# Patient Record
Sex: Female | Born: 1980 | Race: White | Hispanic: No | Marital: Married | Smoking: Former smoker
Health system: Southern US, Community
[De-identification: ages and names within clinical notes are randomized; demographics above are authoritative.]

## PROBLEM LIST (undated history)

## (undated) DIAGNOSIS — F419 Anxiety disorder, unspecified: Secondary | ICD-10-CM

## (undated) HISTORY — PX: APPENDECTOMY: SHX54

---

## 2005-08-07 ENCOUNTER — Emergency Department: Payer: Self-pay | Admitting: Emergency Medicine

## 2006-11-07 ENCOUNTER — Ambulatory Visit: Payer: Self-pay

## 2006-12-28 ENCOUNTER — Observation Stay: Payer: Self-pay

## 2007-01-06 ENCOUNTER — Inpatient Hospital Stay: Payer: Self-pay

## 2010-01-01 ENCOUNTER — Inpatient Hospital Stay (HOSPITAL_COMMUNITY): Admission: RE | Admit: 2010-01-01 | Discharge: 2010-01-02 | Payer: Self-pay | Admitting: Certified Nurse Midwife

## 2010-01-03 ENCOUNTER — Encounter (INDEPENDENT_AMBULATORY_CARE_PROVIDER_SITE_OTHER): Payer: Self-pay | Admitting: Obstetrics & Gynecology

## 2010-01-03 ENCOUNTER — Inpatient Hospital Stay (HOSPITAL_COMMUNITY): Admission: AD | Admit: 2010-01-03 | Discharge: 2010-01-06 | Payer: Self-pay | Admitting: Obstetrics & Gynecology

## 2010-01-07 ENCOUNTER — Inpatient Hospital Stay (HOSPITAL_COMMUNITY): Admission: AD | Admit: 2010-01-07 | Discharge: 2010-01-07 | Payer: Self-pay | Admitting: Obstetrics & Gynecology

## 2010-11-14 LAB — URIC ACID
Uric Acid, Serum: 5.1 mg/dL (ref 2.4–7.0)
Uric Acid, Serum: 5.3 mg/dL (ref 2.4–7.0)
Uric Acid, Serum: 5.3 mg/dL (ref 2.4–7.0)

## 2010-11-14 LAB — CBC
HCT: 31.9 % — ABNORMAL LOW (ref 36.0–46.0)
HCT: 38.2 % (ref 36.0–46.0)
HCT: 40.6 % (ref 36.0–46.0)
Hemoglobin: 11 g/dL — ABNORMAL LOW (ref 12.0–15.0)
Hemoglobin: 13.1 g/dL (ref 12.0–15.0)
MCHC: 34.1 g/dL (ref 30.0–36.0)
MCV: 88.7 fL (ref 78.0–100.0)
MCV: 89.5 fL (ref 78.0–100.0)
Platelets: 197 10*3/uL (ref 150–400)
Platelets: 215 10*3/uL (ref 150–400)
Platelets: 258 10*3/uL (ref 150–400)
RBC: 3.59 MIL/uL — ABNORMAL LOW (ref 3.87–5.11)
RBC: 4.28 MIL/uL (ref 3.87–5.11)
RDW: 15.2 % (ref 11.5–15.5)
RDW: 15.3 % (ref 11.5–15.5)
WBC: 12.5 10*3/uL — ABNORMAL HIGH (ref 4.0–10.5)
WBC: 16.1 10*3/uL — ABNORMAL HIGH (ref 4.0–10.5)
WBC: 9.3 10*3/uL (ref 4.0–10.5)
WBC: 9.8 10*3/uL (ref 4.0–10.5)

## 2010-11-14 LAB — DIFFERENTIAL
Basophils Absolute: 0.1 10*3/uL (ref 0.0–0.1)
Basophils Relative: 1 % (ref 0–1)
Eosinophils Absolute: 0.2 10*3/uL (ref 0.0–0.7)
Neutrophils Relative %: 76 % (ref 43–77)

## 2010-11-14 LAB — COMPREHENSIVE METABOLIC PANEL
ALT: 13 U/L (ref 0–35)
ALT: 32 U/L (ref 0–35)
Albumin: 3 g/dL — ABNORMAL LOW (ref 3.5–5.2)
Albumin: 3.1 g/dL — ABNORMAL LOW (ref 3.5–5.2)
Alkaline Phosphatase: 141 U/L — ABNORMAL HIGH (ref 39–117)
Alkaline Phosphatase: 150 U/L — ABNORMAL HIGH (ref 39–117)
BUN: 7 mg/dL (ref 6–23)
BUN: 7 mg/dL (ref 6–23)
CO2: 29 mEq/L (ref 19–32)
Calcium: 8.8 mg/dL (ref 8.4–10.5)
Calcium: 9.4 mg/dL (ref 8.4–10.5)
Chloride: 105 mEq/L (ref 96–112)
Creatinine, Ser: 0.58 mg/dL (ref 0.4–1.2)
GFR calc Af Amer: >60 mL/min (ref >60)
GFR calc non Af Amer: >60 mL/min (ref >60)
Glucose, Bld: 80 mg/dL (ref 70–99)
Glucose, Bld: 83 mg/dL (ref 70–99)
Potassium: 3.2 mEq/L — ABNORMAL LOW (ref 3.5–5.1)
Potassium: 3.6 mEq/L (ref 3.5–5.1)
Potassium: 3.7 mEq/L (ref 3.5–5.1)
Sodium: 135 mEq/L (ref 135–145)
Total Bilirubin: 0.4 mg/dL (ref 0.3–1.2)
Total Bilirubin: 0.5 mg/dL (ref 0.3–1.2)
Total Protein: 5.9 g/dL — ABNORMAL LOW (ref 6.0–8.3)

## 2010-11-14 LAB — URINALYSIS, ROUTINE W REFLEX MICROSCOPIC
Glucose, UA: NEGATIVE mg/dL
Protein, ur: NEGATIVE mg/dL
Specific Gravity, Urine: 1.015 (ref 1.005–1.030)

## 2010-11-14 LAB — URINE MICROSCOPIC-ADD ON

## 2010-11-14 LAB — RPR
RPR Ser Ql: NONREACTIVE
RPR Ser Ql: NONREACTIVE

## 2010-11-14 LAB — LACTATE DEHYDROGENASE
LDH: 173 U/L (ref 94–250)
LDH: 185 U/L (ref 94–250)

## 2014-04-17 ENCOUNTER — Emergency Department: Payer: Self-pay | Admitting: Emergency Medicine

## 2014-04-18 LAB — CBC WITH DIFFERENTIAL/PLATELET
BASOS ABS: 0.1 10*3/uL (ref 0.0–0.1)
Basophil %: 1.6 %
Eosinophil #: 0.1 10*3/uL (ref 0.0–0.7)
Eosinophil %: 1.1 %
HCT: 39.3 % (ref 35.0–47.0)
HGB: 12.5 g/dL (ref 12.0–16.0)
LYMPHS ABS: 2.7 10*3/uL (ref 1.0–3.6)
Lymphocyte %: 38.5 %
MCH: 27.4 pg (ref 26.0–34.0)
MCHC: 31.7 g/dL — ABNORMAL LOW (ref 32.0–36.0)
MCV: 87 fL (ref 80–100)
MONO ABS: 0.5 x10 3/mm (ref 0.2–0.9)
MONOS PCT: 7.3 %
NEUTROS ABS: 3.6 10*3/uL (ref 1.4–6.5)
NEUTROS PCT: 51.5 %
PLATELETS: 270 10*3/uL (ref 150–440)
RBC: 4.55 10*6/uL (ref 3.80–5.20)
RDW: 14.1 % (ref 11.5–14.5)
WBC: 7 10*3/uL (ref 3.6–11.0)

## 2014-04-18 LAB — URINALYSIS, COMPLETE
BACTERIA: NONE SEEN
Bilirubin,UR: NEGATIVE
GLUCOSE, UR: NEGATIVE mg/dL (ref 0–75)
KETONE: NEGATIVE
LEUKOCYTE ESTERASE: NEGATIVE
Nitrite: NEGATIVE
PH: 6 (ref 4.5–8.0)
Protein: NEGATIVE
Specific Gravity: 1.011 (ref 1.003–1.030)
Squamous Epithelial: 1
WBC UR: NONE SEEN /HPF (ref 0–5)

## 2014-04-18 LAB — COMPREHENSIVE METABOLIC PANEL
ALBUMIN: 4.2 g/dL (ref 3.4–5.0)
ALT: 14 U/L
AST: 22 U/L (ref 15–37)
Alkaline Phosphatase: 78 U/L
Anion Gap: 5 — ABNORMAL LOW (ref 7–16)
BUN: 8 mg/dL (ref 7–18)
Bilirubin,Total: 0.2 mg/dL (ref 0.2–1.0)
CALCIUM: 9.1 mg/dL (ref 8.5–10.1)
CHLORIDE: 108 mmol/L — AB (ref 98–107)
CO2: 29 mmol/L (ref 21–32)
Creatinine: 0.82 mg/dL (ref 0.60–1.30)
Glucose: 98 mg/dL (ref 65–99)
OSMOLALITY: 281 (ref 275–301)
POTASSIUM: 3.5 mmol/L (ref 3.5–5.1)
SODIUM: 142 mmol/L (ref 136–145)
TOTAL PROTEIN: 7.6 g/dL (ref 6.4–8.2)

## 2014-04-18 LAB — PREGNANCY, URINE: PREGNANCY TEST, URINE: NEGATIVE m[IU]/mL

## 2014-04-18 LAB — TROPONIN I: Troponin-I: <0.02 ng/mL

## 2014-04-18 LAB — LIPASE, BLOOD: LIPASE: 150 U/L (ref 73–393)

## 2014-06-03 ENCOUNTER — Ambulatory Visit: Payer: Self-pay | Admitting: Family Medicine

## 2014-12-22 LAB — HM PAP SMEAR: HM PAP: NEGATIVE

## 2015-05-31 ENCOUNTER — Other Ambulatory Visit: Payer: Self-pay | Admitting: Obstetrics and Gynecology

## 2015-05-31 ENCOUNTER — Ambulatory Visit: Payer: Self-pay

## 2015-05-31 ENCOUNTER — Telehealth: Payer: Self-pay | Admitting: Obstetrics and Gynecology

## 2015-05-31 MED ORDER — NORETHINDRONE 0.35 MG PO TABS: 1.0000 | ORAL_TABLET | Freq: Every day | ORAL | Status: DC

## 2015-05-31 NOTE — Telephone Encounter (Signed)
PT CALLED AND SHE HAS ABOUT 2 WEEKS LEFT AND 1 REFILL ON THE BC YOU PERSCRIBED [facility], SHE WANTED TO KNOW IF YOU NEEDED TO SEE HER FOR A F/U ON THE BC PILL OR CAN SHE JUST GET A REFILL, SHE STATED IT IS WORKING WELL FOR HER.

## 2015-05-31 NOTE — Telephone Encounter (Signed)
Please let her know I sent in a refill, make sure the pharmacy gives her the same thing she was on and doesn't switch it to another generic

## 2015-05-31 NOTE — Telephone Encounter (Signed)
Sent rx to pharmacy

## 2015-06-06 ENCOUNTER — Telehealth: Payer: Self-pay | Admitting: Obstetrics and Gynecology

## 2015-06-06 ENCOUNTER — Other Ambulatory Visit: Payer: Self-pay | Admitting: Obstetrics and Gynecology

## 2015-06-06 NOTE — Telephone Encounter (Signed)
PT CALLED AND SHE STOPPED BY THE OFFICE TO DO A REFILL ON HER RX, ONE WAS SENT TO THE PHARMACY AND IT WAS A DIFFERENT GENERIC THEN WHAT SHE WAS ON, SO SHE WANTED TO KNOW IF THIS WAS WHAT WAS SUPPOSE TO HAPPEN OR THIS WAS A MISTAKE, SHE WOULD LIKE A CALL BACK.

## 2015-06-07 NOTE — Telephone Encounter (Signed)
Correct medication was sent in

## 2015-06-08 ENCOUNTER — Ambulatory Visit (INDEPENDENT_AMBULATORY_CARE_PROVIDER_SITE_OTHER): Payer: BLUE CROSS/BLUE SHIELD | Admitting: Family Medicine

## 2015-06-08 ENCOUNTER — Encounter: Payer: Self-pay | Admitting: Family Medicine

## 2015-06-08 VITALS — BP 126/82 | HR 73 | Temp 98.4°F | Ht 61.25 in | Wt 148.0 lb

## 2015-06-08 DIAGNOSIS — Z0000 Encounter for general adult medical examination without abnormal findings: Secondary | ICD-10-CM | POA: Diagnosis not present

## 2015-06-08 DIAGNOSIS — E663 Overweight: Secondary | ICD-10-CM | POA: Insufficient documentation

## 2015-06-08 DIAGNOSIS — Z23 Encounter for immunization: Secondary | ICD-10-CM

## 2015-06-08 NOTE — Patient Instructions (Addendum)
Try to follow the DASH guidelines Consider the mammogram, as some groups recommend a baseline at age 35 and yearly starting at age 35; another group says it's okay to wait until age 14, so there is a lot of variation in our recommendations Try to eat better and move more Next physical in one year  We drew labs today If you have not heard anything from my staff in a week about any orders/referrals/studies from today, please contact us here to follow-up [phone] You received the flu shot today; it should protect you against the flu virus over the coming months; it will take about two weeks for antibodies to develop; do try to stay away from hospitals, nursing homes, and daycares during peak flu season; taking 1000 mg of vitamin C daily during flu season may help you avoid getting sick Check out the information at familydoctor.org entitled "What It Takes to Lose Weight" Try to lose between 1 pound per week by taking in fewer calories and burning off more calories You can succeed by limiting portions, limiting foods dense in calories and fat, becoming more active, and drinking 8 glasses of water a day (64 ounces) Don't skip meals, especially breakfast, as skipping meals may alter your metabolism Do not use over-the-counter weight loss pills or gimmicks that claim rapid weight loss A healthy BMI (or body mass index) is between 18.5 and 24.9 You can calculate your ideal BMI at the [place] website [employer]fr   Health Maintenance, Female Adopting a healthy lifestyle and getting preventive care can go a long way to promote health and wellness. Talk with your health care provider about what schedule of regular examinations is right for you. This is a good chance for you to check in with your provider about disease prevention and staying healthy. In between checkups, there are plenty of things you can do on your own. Experts have done a lot of  research about which lifestyle changes and preventive measures are most likely to keep you healthy. Ask your health care provider for more information. WEIGHT AND DIET  Eat a healthy diet  Be sure to include plenty of vegetables, fruits, low-fat dairy products, and lean protein.  Do not eat a lot of foods high in solid fats, added sugars, or salt.  Get regular exercise. This is one of the most important things you can do for your health.  Most adults should exercise for at least 150 minutes each week. The exercise should increase your heart rate and make you sweat (moderate-intensity exercise).  Most adults should also do strengthening exercises at least twice a week. This is in addition to the moderate-intensity exercise.  Maintain a healthy weight  Body mass index (BMI) is a measurement that can be used to identify possible weight problems. It estimates body fat based on height and weight. Your health care provider can help determine your BMI and help you achieve or maintain a healthy weight.  For females 35 years of age and older:   A BMI below 18.5 is considered underweight.  A BMI of 18.5 to 24.9 is normal.  A BMI of 25 to 29.9 is considered overweight.  A BMI of 30 and above is considered obese.  Watch levels of cholesterol and blood lipids  You should start having your blood tested for lipids and cholesterol at 35 years of age, then have this test every 5 years.  You may need to have your cholesterol levels checked more often if:  Your lipid  or cholesterol levels are high.  You are older than 35 years of age.  You are at high risk for heart disease.  CANCER SCREENING   Lung Cancer  Lung cancer screening is recommended for adults 35-61 years old who are at high risk for lung cancer because of a history of smoking.  A yearly low-dose CT scan of the lungs is recommended for people who:  Currently smoke.  Have quit within the past 35 years.  Have at least a  35-pack-year history of smoking. A pack year is smoking an average of one pack of cigarettes a day for 1 year.  Yearly screening should continue until it has been 15 years since you quit.  Yearly screening should stop if you develop a health problem that would prevent you from having lung cancer treatment.  Breast Cancer  Practice breast self-awareness. This means understanding how your breasts normally appear and feel.  It also means doing regular breast self-exams. Let your health care provider know about any changes, no matter how small.  If you are in your 20s or 35s, you should have a clinical breast exam (CBE) by a health care provider every 1-3 years as part of a regular health exam.  If you are 35 or older, have a CBE every year. Also consider having a breast X-ray (mammogram) every year.  If you have a family history of breast cancer, talk to your health care provider about genetic screening.  If you are at high risk for breast cancer, talk to your health care provider about having an MRI and a mammogram every year.  Breast cancer gene (BRCA) assessment is recommended for women who have family members with BRCA-related cancers. BRCA-related cancers include:  Breast.  Ovarian.  Tubal.  Peritoneal cancers.  Results of the assessment will determine the need for genetic counseling and BRCA1 and BRCA2 testing. Cervical Cancer Your health care provider may recommend that you be screened regularly for cancer of the pelvic organs (ovaries, uterus, and vagina). This screening involves a pelvic examination, including checking for microscopic changes to the surface of your cervix (Pap test). You may be encouraged to have this screening done every 3 years, beginning at age 35.  For women ages 35-65, health care providers may recommend pelvic exams and Pap testing every 3 years, or they may recommend the Pap and pelvic exam, combined with testing for human papilloma virus (HPV), every 5  years. Some types of HPV increase your risk of cervical cancer. Testing for HPV may also be done on women of any age with unclear Pap test results.  Other health care providers may not recommend any screening for nonpregnant women who are considered low risk for pelvic cancer and who do not have symptoms. Ask your health care provider if a screening pelvic exam is right for you.  If you have had past treatment for cervical cancer or a condition that could lead to cancer, you need Pap tests and screening for cancer for at least 20 years after your treatment. If Pap tests have been discontinued, your risk factors (such as having a new sexual partner) need to be reassessed to determine if screening should resume. Some women have medical problems that increase the chance of getting cervical cancer. In these cases, your health care provider may recommend more frequent screening and Pap tests. Colorectal Cancer  This type of cancer can be detected and often prevented.  Routine colorectal cancer screening usually begins at 35 years of age  and continues through 35 years of age.  Your health care provider may recommend screening at an earlier age if you have risk factors for colon cancer.  Your health care provider may also recommend using home test kits to check for hidden blood in the stool.  A small camera at the end of a tube can be used to examine your colon directly (sigmoidoscopy or colonoscopy). This is done to check for the earliest forms of colorectal cancer.  Routine screening usually begins at age 25.  Direct examination of the colon should be repeated every 5-10 years through 35 years of age. However, you may need to be screened more often if early forms of precancerous polyps or small growths are found. Skin Cancer  Check your skin from head to toe regularly.  Tell your health care provider about any new moles or changes in moles, especially if there is a change in a mole's shape or  color.  Also tell your health care provider if you have a mole that is larger than the size of a pencil eraser.  Always use sunscreen. Apply sunscreen liberally and repeatedly throughout the day.  Protect yourself by wearing long sleeves, pants, a wide-brimmed hat, and sunglasses whenever you are outside. HEART DISEASE, DIABETES, AND HIGH BLOOD PRESSURE   High blood pressure causes heart disease and increases the risk of stroke. High blood pressure is more likely to develop in:  People who have blood pressure in the high end of the normal range (130-139/85-89 mm Hg).  People who are overweight or obese.  People who are African American.  If you are 19-33 years of age, have your blood pressure checked every 3-5 years. If you are 65 years of age or older, have your blood pressure checked every year. You should have your blood pressure measured twice--once when you are at a hospital or clinic, and once when you are not at a hospital or clinic. Record the average of the two measurements. To check your blood pressure when you are not at a hospital or clinic, you can use:  An automated blood pressure machine at a pharmacy.  A home blood pressure monitor.  If you are between 47 years and 31 years old, ask your health care provider if you should take aspirin to prevent strokes.  Have regular diabetes screenings. This involves taking a blood sample to check your fasting blood sugar level.  If you are at a normal weight and have a low risk for diabetes, have this test once every three years after 35 years of age.  If you are overweight and have a high risk for diabetes, consider being tested at a younger age or more often. PREVENTING INFECTION  Hepatitis B  If you have a higher risk for hepatitis B, you should be screened for this virus. You are considered at high risk for hepatitis B if:  You were born in a country where hepatitis B is common. Ask your health care provider which countries  are considered high risk.  Your parents were born in a high-risk country, and you have not been immunized against hepatitis B (hepatitis B vaccine).  You have HIV or AIDS.  You use needles to inject street drugs.  You live with someone who has hepatitis B.  You have had sex with someone who has hepatitis B.  You get hemodialysis treatment.  You take certain medicines for conditions, including cancer, organ transplantation, and autoimmune conditions. Hepatitis C  Blood testing is recommended  for:  Everyone born from [birth year] through 1966.  Anyone with known risk factors for hepatitis C. Sexually transmitted infections (STIs)  You should be screened for sexually transmitted infections (STIs) including gonorrhea and chlamydia if:  You are sexually active and are younger than 35 years of age.  You are older than 35 years of age and your health care provider tells you that you are at risk for this type of infection.  Your sexual activity has changed since you were last screened and you are at an increased risk for chlamydia or gonorrhea. Ask your health care provider if you are at risk.  If you do not have HIV, but are at risk, it may be recommended that you take a prescription medicine daily to prevent HIV infection. This is called pre-exposure prophylaxis (PrEP). You are considered at risk if:  You are sexually active and do not regularly use condoms or know the HIV status of your partner(s).  You take drugs by injection.  You are sexually active with a partner who has HIV. Talk with your health care provider about whether you are at high risk of being infected with HIV. If you choose to begin PrEP, you should first be tested for HIV. You should then be tested every 3 months for as long as you are taking PrEP.  PREGNANCY   If you are premenopausal and you may become pregnant, ask your health care provider about preconception counseling.  If you may become pregnant, take 400 to  800 micrograms (mcg) of folic acid every day.  If you want to prevent pregnancy, talk to your health care provider about birth control (contraception). OSTEOPOROSIS AND MENOPAUSE   Osteoporosis is a disease in which the bones lose minerals and strength with aging. This can result in serious bone fractures. Your risk for osteoporosis can be identified using a bone density scan.  If you are 19 years of age or older, or if you are at risk for osteoporosis and fractures, ask your health care provider if you should be screened.  Ask your health care provider whether you should take a calcium or vitamin D supplement to lower your risk for osteoporosis.  Menopause may have certain physical symptoms and risks.  Hormone replacement therapy may reduce some of these symptoms and risks. Talk to your health care provider about whether hormone replacement therapy is right for you.  HOME CARE INSTRUCTIONS   Schedule regular health, dental, and eye exams.  Stay current with your immunizations.   Do not use any tobacco products including cigarettes, chewing tobacco, or electronic cigarettes.  If you are pregnant, do not drink alcohol.  If you are breastfeeding, limit how much and how often you drink alcohol.  Limit alcohol intake to no more than 1 drink per day for nonpregnant women. One drink equals 12 ounces of beer, 5 ounces of wine, or 1 ounces of hard liquor.  Do not use street drugs.  Do not share needles.  Ask your health care provider for help if you need support or information about quitting drugs.  Tell your health care provider if you often feel depressed.  Tell your health care provider if you have ever been abused or do not feel safe at home.   This information is not intended to replace advice given to you by your health care provider. Make sure you discuss any questions you have with your health care provider.   Document Released: 02/26/2011 Document Revised: 09/03/2014  Document Reviewed: 07/15/2013  Chartered certified accountant Patient Education [employer].

## 2015-06-08 NOTE — Assessment & Plan Note (Signed)
Encouraged modest weight loss; see AVS 

## 2015-06-08 NOTE — Progress Notes (Signed)
Patient ID: Veronica Jenkins, female   DOB: [birth year], 34 y.o.   MRN: [id number]  Subjective:   Veronica Jenkins is a 34 y.o. female here for a complete physical exam  Interim issues since last visit: no medical excitement; seeing ENT, he recommended CBC with diff, swollen gland on the left side of the neck  USPSTF grade A and B recommendations: Alcohol: socially, less than 7 per week; no more than 3 at any one time Depression screen [facility] 2/9 06/08/2015  Decreased Interest 0  Down, Depressed, Hopeless 0  PHQ - 2 Score 0  HTN: prehypertension, no meds, DASH guidelines Obesity: no, mildly overweight Tobacco: n/a BRCA gene screening: n/a Abuse: no Mammogram: discussed Cervical cancer: last pap May, breast and pelvic exams by gyn Lipid and glucose: had a banana and coffee, cream and sugar HIV, hepatitis B and C: politely declined Colorectal cancer: no early family members with colorectal cancer, start at 68 Diet: typical American diet; not enough fruits and veggies Exercise: no regular exercise, not active enough Skin cancer: no tanning beds, no lengthy unprotected peak sun exposure  History reviewed. No pertinent past medical history. Past Surgical History  Procedure Laterality Date  . Cesarean section     Family History  Problem Relation Age of Onset  . Congestive Heart Failure Maternal Grandfather   . Heart disease Maternal Grandfather   . Diabetes Neg Hx   . Cancer Neg Hx   . Hypertension Neg Hx   . Stroke Neg Hx   . COPD Neg Hx    Social History  Substance Use Topics  . Smoking status: Former Smoker -- 0.50 packs/day for 10 years    Types: Cigarettes    Quit date: 06/07/2008  . Smokeless tobacco: Never Used  . Alcohol Use: No     Comment: rare   Review of Systems  Constitutional: Negative for diaphoresis and unexpected weight change.  HENT: Negative for dental problem (goes every 6 months), hearing loss and mouth sores.   Eyes: Negative for visual disturbance.   Respiratory: Negative for cough and shortness of breath.   Cardiovascular: Negative for chest pain and palpitations.  Gastrointestinal: Negative for blood in stool.  Endocrine: Negative for cold intolerance, heat intolerance and polydipsia.  Genitourinary: Negative for dysuria and hematuria.  Musculoskeletal: Negative for joint swelling and arthralgias.  Skin:       No worrisome moles  Allergic/Immunologic: Negative for food allergies.  Neurological: Negative for tremors.  Hematological: Does not bruise/bleed easily.  Psychiatric/Behavioral: Negative for dysphoric mood.    Objective:   Filed Vitals:   06/08/15 1021  BP: 126/82  Pulse: 73  Temp: 98.4 F (36.9 C)  Height: 5' 1.25" (1.556 m)  Weight: 148 lb (67.132 kg)  SpO2: 99%   Body mass index is 27.73 kg/(m^2). Wt Readings from Last 3 Encounters:  06/08/15 148 lb (67.132 kg)  11/30/14 145 lb (65.772 kg)   Physical Exam  Constitutional: She appears well-developed and well-nourished.  HENT:  Head: Normocephalic and atraumatic.  Right Ear: Hearing, tympanic membrane, external ear and ear canal normal.  Left Ear: Hearing, tympanic membrane, external ear and ear canal normal.  Eyes: Conjunctivae and EOM are normal. Right eye exhibits no hordeolum. Left eye exhibits no hordeolum. No scleral icterus.  Neck: Carotid bruit is not present. No thyromegaly present.  Cardiovascular: Normal rate, regular rhythm, S1 normal, S2 normal and normal heart sounds.   No extrasystoles are present.  No murmur heard. Pulmonary/Chest: Effort normal and  breath sounds normal. No respiratory distress. She has no wheezes.  Abdominal: Soft. Normal appearance and bowel sounds are normal. She exhibits no distension, no abdominal bruit, no pulsatile midline mass and no mass. There is no hepatosplenomegaly. There is no tenderness. There is no guarding. No hernia.  Musculoskeletal: Normal range of motion. She exhibits no edema.  Lymphadenopathy:        Head (right side): No submandibular adenopathy present.       Head (left side): No submandibular adenopathy present.    She has no cervical adenopathy.    She has no axillary adenopathy.  Neurological: She is alert. She displays no tremor. No cranial nerve deficit. She exhibits normal muscle tone. Gait normal.  Reflex Scores:      Patellar reflexes are 2+ on the right side and 2+ on the left side. Skin: Skin is warm and dry. No bruising and no ecchymosis noted. No cyanosis. No pallor.  Psychiatric: Her speech is normal and behavior is normal. Thought content normal. Her mood appears not anxious. She does not exhibit a depressed mood.    Assessment/Plan:   Problem List Items Addressed This Visit      Other   Needs flu shot    Flu vaccine given; may take 2 weeks to develop antibodies; avoid nursing homes, daycares, etc during peak flu season; hand hygiene encouraged      Preventative health care - Primary    USPSTF grade A and B recommendations reviewed with patient; age-appropriate recommendations, preventive care, screening tests, etc discussed and encouraged; healthy living encouraged; see AVS for patient education given to patient; GYN components done through gynecologist's office; she will consider mammogram, discussed screening in detail      Relevant Orders   CBC with Differential/Platelet   Lipid Panel w/o Chol/HDL Ratio   TSH   Comprehensive metabolic panel   Overweight (BMI 25.0-29.9)    Encouraged modest weight loss; see AVS       Other Visit Diagnoses    Encounter for immunization           No orders of the defined types were placed in this encounter.   Orders Placed This Encounter  Procedures  . Flu Vaccine QUAD 36+ mos IM  . CBC with Differential/Platelet  . Lipid Panel w/o Chol/HDL Ratio    Order Specific Question:  Has the patient fasted?    Answer:  Yes  . TSH  . Comprehensive metabolic panel    Order Specific Question:  Has the patient fasted?     Answer:  Yes    Follow up plan: Return in about 1 year (around 06/07/2016) for complete physical.  An after-visit summary was printed and given to the patient at [facility].  Please see the patient instructions which may contain other information and recommendations beyond what is mentioned above in the assessment and plan.  Orders Placed This Encounter  Procedures  . Flu Vaccine QUAD 36+ mos IM  . CBC with Differential/Platelet  . Lipid Panel w/o Chol/HDL Ratio  . TSH  . Comprehensive metabolic panel

## 2015-06-08 NOTE — Assessment & Plan Note (Signed)
Flu vaccine given; may take 2 weeks to develop antibodies; avoid nursing homes, daycares, etc during peak flu season; hand hygiene encouraged

## 2015-06-08 NOTE — Assessment & Plan Note (Signed)
USPSTF grade A and B recommendations reviewed with patient; age-appropriate recommendations, preventive care, screening tests, etc discussed and encouraged; healthy living encouraged; see AVS for patient education given to patient; GYN components done through gynecologist's office; she will consider mammogram, discussed screening in detail

## 2015-06-09 LAB — CBC WITH DIFFERENTIAL/PLATELET
BASOS ABS: 0 10*3/uL (ref 0.0–0.2)
Basos: 1 %
EOS (ABSOLUTE): 0.1 10*3/uL (ref 0.0–0.4)
Eos: 2 %
Hematocrit: 40.5 % (ref 34.0–46.6)
Hemoglobin: 13.3 g/dL (ref 11.1–15.9)
Immature Grans (Abs): 0 10*3/uL (ref 0.0–0.1)
Immature Granulocytes: 0 %
LYMPHS ABS: 2.2 10*3/uL (ref 0.7–3.1)
Lymphs: 38 %
MCH: 28.5 pg (ref 26.6–33.0)
MCHC: 32.8 g/dL (ref 31.5–35.7)
MCV: 87 fL (ref 79–97)
MONOS ABS: 0.6 10*3/uL (ref 0.1–0.9)
Monocytes: 11 %
Neutrophils Absolute: 2.7 10*3/uL (ref 1.4–7.0)
Neutrophils: 48 %
Platelets: 293 10*3/uL (ref 150–379)
RBC: 4.67 x10E6/uL (ref 3.77–5.28)
RDW: 14.4 % (ref 12.3–15.4)
WBC: 5.7 10*3/uL (ref 3.4–10.8)

## 2015-06-09 LAB — COMPREHENSIVE METABOLIC PANEL
ALBUMIN: 4.4 g/dL (ref 3.5–5.5)
ALT: 8 IU/L (ref 0–32)
AST: 18 IU/L (ref 0–40)
Albumin/Globulin Ratio: 1.9 (ref 1.1–2.5)
Alkaline Phosphatase: 62 IU/L (ref 39–117)
BUN / CREAT RATIO: 9 (ref 8–20)
BUN: 6 mg/dL (ref 6–20)
Bilirubin Total: 0.3 mg/dL (ref 0.0–1.2)
CALCIUM: 9.1 mg/dL (ref 8.7–10.2)
CO2: 24 mmol/L (ref 18–29)
CREATININE: 0.7 mg/dL (ref 0.57–1.00)
Chloride: 102 mmol/L (ref 97–108)
GFR calc Af Amer: 130 mL/min/{1.73_m2} (ref >59)
GFR, EST NON AFRICAN AMERICAN: 113 mL/min/{1.73_m2} (ref >59)
GLOBULIN, TOTAL: 2.3 g/dL (ref 1.5–4.5)
Glucose: 81 mg/dL (ref 65–99)
Potassium: 4.3 mmol/L (ref 3.5–5.2)
SODIUM: 143 mmol/L (ref 134–144)
Total Protein: 6.7 g/dL (ref 6.0–8.5)

## 2015-06-09 LAB — LIPID PANEL W/O CHOL/HDL RATIO
CHOLESTEROL TOTAL: 174 mg/dL (ref 100–199)
HDL: 62 mg/dL (ref >39)
LDL Calculated: 101 mg/dL — ABNORMAL HIGH (ref 0–99)
TRIGLYCERIDES: 53 mg/dL (ref 0–149)
VLDL Cholesterol Cal: 11 mg/dL (ref 5–40)

## 2015-06-09 LAB — TSH: TSH: 1.72 u[IU]/mL (ref 0.450–4.500)

## 2015-06-10 ENCOUNTER — Telehealth: Payer: Self-pay | Admitting: Obstetrics and Gynecology

## 2015-06-10 NOTE — Telephone Encounter (Signed)
Vaginal bleeding on and off since 10/1, not heavy, low dull back ache. Wondered if she needs to be seen?

## 2015-06-13 NOTE — Telephone Encounter (Signed)
States for the past 19 days she has 15 bleed days, not heavy, just very aggrevating pls advise

## 2015-06-14 NOTE — Telephone Encounter (Signed)
Please let her know if pharmacy gave her a different company generic of her pill that could be the reason, not a problem, but I know it is anoying.  She can let the pharmacy know she wants to get the 'exact' same type she was on before.

## 2015-06-14 NOTE — Telephone Encounter (Signed)
Called pt appt was made for 06/15/15

## 2015-06-15 ENCOUNTER — Encounter: Payer: Self-pay | Admitting: Family Medicine

## 2015-06-15 ENCOUNTER — Encounter: Payer: Self-pay | Admitting: Obstetrics and Gynecology

## 2015-06-15 ENCOUNTER — Ambulatory Visit (INDEPENDENT_AMBULATORY_CARE_PROVIDER_SITE_OTHER): Payer: BLUE CROSS/BLUE SHIELD | Admitting: Obstetrics and Gynecology

## 2015-06-15 VITALS — BP 117/86 | HR 76 | Ht 61.0 in | Wt 144.8 lb

## 2015-06-15 DIAGNOSIS — N921 Excessive and frequent menstruation with irregular cycle: Secondary | ICD-10-CM | POA: Diagnosis not present

## 2015-06-15 NOTE — Progress Notes (Signed)
Patient ID: Veronica Jenkins, female   DOB: December 11, 1980, 34 y.o.   MRN: [id number]  S: reports spotting for 3-4 days every 2 weeks since starting progestin only pill since starting in June, with heavier bleeding x 10 days.  O: A&O x4 Well groomed female in no distress thyroid normal Pelvic exam declined Labs from last week reviewed and allnormal  A: BTB on progestin only pill, with h/o dysmenorrhea and menorrhagia  P: desire cessation of pills at this time and seeing if menses with self regulate. Offered Lysteda, Mirena, Depo and combined OCPs- declined all at this time. Will keep menstrual chart and notify me if this worsen and desires treatment.  RTC prn  Meeyah Ovitt burr, CNM

## 2015-07-26 ENCOUNTER — Ambulatory Visit: Payer: BLUE CROSS/BLUE SHIELD | Admitting: Unknown Physician Specialty

## 2015-09-08 DIAGNOSIS — M41126 Adolescent idiopathic scoliosis, lumbar region: Secondary | ICD-10-CM | POA: Insufficient documentation

## 2015-12-12 ENCOUNTER — Ambulatory Visit: Payer: BLUE CROSS/BLUE SHIELD | Admitting: Family Medicine

## 2015-12-20 ENCOUNTER — Telehealth: Payer: Self-pay | Admitting: Family Medicine

## 2015-12-27 ENCOUNTER — Ambulatory Visit (INDEPENDENT_AMBULATORY_CARE_PROVIDER_SITE_OTHER): Payer: BLUE CROSS/BLUE SHIELD | Admitting: Obstetrics and Gynecology

## 2015-12-27 ENCOUNTER — Other Ambulatory Visit: Payer: Self-pay | Admitting: Obstetrics and Gynecology

## 2015-12-27 ENCOUNTER — Encounter: Payer: Self-pay | Admitting: Obstetrics and Gynecology

## 2015-12-27 VITALS — BP 128/100 | HR 98 | Ht 62.0 in | Wt 139.3 lb

## 2015-12-27 DIAGNOSIS — Z01419 Encounter for gynecological examination (general) (routine) without abnormal findings: Secondary | ICD-10-CM | POA: Diagnosis not present

## 2015-12-27 NOTE — Progress Notes (Signed)
Subjective:   Veronica Jenkins is a 35 y.o. No obstetric history on file. Caucasian female here for a routine well-woman exam.  Patient's last menstrual period was 12/23/2015.    Current complaints: PMS occasional inclusional vulvar cyst PCP: Lada       does desire labs  Social History: Sexual: heterosexual Marital Status: married Living situation: with spouse Occupation: homemaker Tobacco/alcohol: no tobacco use Illicit drugs: no history of illicit drug use  The following portions of the patient's history were reviewed and updated as appropriate: allergies, current medications, past family history, past medical history, past social history, past surgical history and problem list.  Past Medical History History reviewed. No pertinent past medical history.  Past Surgical History Past Surgical History  Procedure Laterality Date  . Cesarean section      Gynecologic History No obstetric history on file.  Patient's last menstrual period was 12/23/2015. Contraception: vasectomy Last Pap: 2016. Results were: normal   Obstetric History OB History  No data available    Current Medications No current outpatient prescriptions on file prior to visit.   No current facility-administered medications on file prior to visit.    Review of Systems Patient denies any headaches, blurred vision, shortness of breath, chest pain, abdominal pain, problems with bowel movements, urination, or intercourse.  Objective:  BP 128/100 mmHg  Pulse 98  Ht 5\' 2"  (1.575 m)  Wt 139 lb 4.8 oz (63.186 kg)  BMI 25.47 kg/m2  LMP 12/23/2015 Physical Exam  General:  Well developed, well nourished, no acute distress. She is alert and oriented x3. Skin:  Warm and dry Neck:  Midline trachea, no thyromegaly or nodules Cardiovascular: Regular rate and rhythm, no murmur heard Lungs:  Effort normal, all lung fields clear to auscultation bilaterally Breasts:  No dominant palpable mass, retraction, or nipple  discharge Abdomen:  Soft, non tender, no hepatosplenomegaly or masses Pelvic:  External genitalia is normal in appearance.  The vagina is normal in appearance. The cervix is bulbous, no CMT.  Thin prep pap is done with HR HPV cotesting. Uterus is felt to be normal size, shape, and contour.  No adnexal masses or tenderness noted. Extremities:  No swelling or varicosities noted Psych:  She has a normal mood and affect  Assessment:   Healthy well-woman exam   Plan:  Labs obtained F/U 1 year for AE, or sooner if needed   Ione [place] Suzan NailerN Rosaleigh Brazzel, CNM

## 2015-12-27 NOTE — Patient Instructions (Signed)
  Place annual gynecologic exam patient instructions here.  Thank you for enrolling in MyChart. Please follow the instructions below to securely access your online medical record. MyChart allows you to send messages to your doctor, view your test results, manage appointments, and more.   How Do I Sign Up? 1. In your Internet browser, go to [employer] and enter [email or web address]. 2. Click on the Sign Up Now link in the Sign In box. You will see the New Member Sign Up page. 3. Enter your MyChart Access Code exactly as it appears below. You will not need to use this code after you've completed the sign-up process. If you do not sign up before the expiration date, you must request a new code.  MyChart Access Code: [email or web address] Expires: 01/31/2016 11:20 AM  4. Enter your Social Security Number ([id number]) and Date of Birth (mm/dd/yyyy) as indicated and click Submit. You will be taken to the next sign-up page. 5. Create a MyChart ID. This will be your MyChart login ID and cannot be changed, so think of one that is secure and easy to remember. 6. Create a MyChart password. You can change your password at any time. 7. Enter your Password Reset Question and Answer. This can be used at a later time if you forget your password.  8. Enter your e-mail address. You will receive e-mail notification when new information is available in MyChart. 9. Click Sign Up. You can now view your medical record.   Additional Information Remember, MyChart is NOT to be used for urgent needs. For medical emergencies, dial 911.

## 2015-12-28 LAB — LIPID PANEL
CHOLESTEROL TOTAL: 207 mg/dL — AB (ref 100–199)
Chol/HDL Ratio: 3 ratio units (ref 0.0–4.4)
HDL: 69 mg/dL (ref >39)
LDL Calculated: 126 mg/dL — ABNORMAL HIGH (ref 0–99)
TRIGLYCERIDES: 61 mg/dL (ref 0–149)
VLDL Cholesterol Cal: 12 mg/dL (ref 5–40)

## 2015-12-28 LAB — COMPREHENSIVE METABOLIC PANEL
ALBUMIN: 4.9 g/dL (ref 3.5–5.5)
ALT: 11 IU/L (ref 0–32)
AST: 16 IU/L (ref 0–40)
Albumin/Globulin Ratio: 2.5 — ABNORMAL HIGH (ref 1.2–2.2)
Alkaline Phosphatase: 56 IU/L (ref 39–117)
BUN / CREAT RATIO: 9 (ref 9–23)
BUN: 7 mg/dL (ref 6–20)
Bilirubin Total: 0.2 mg/dL (ref 0.0–1.2)
CALCIUM: 9.7 mg/dL (ref 8.7–10.2)
CO2: 24 mmol/L (ref 18–29)
CREATININE: 0.75 mg/dL (ref 0.57–1.00)
Chloride: 104 mmol/L (ref 96–106)
GFR, EST AFRICAN AMERICAN: 119 mL/min/{1.73_m2} (ref >59)
GFR, EST NON AFRICAN AMERICAN: 104 mL/min/{1.73_m2} (ref >59)
GLOBULIN, TOTAL: 2 g/dL (ref 1.5–4.5)
Glucose: 79 mg/dL (ref 65–99)
Potassium: 4.3 mmol/L (ref 3.5–5.2)
SODIUM: 145 mmol/L — AB (ref 134–144)
TOTAL PROTEIN: 6.9 g/dL (ref 6.0–8.5)

## 2015-12-29 LAB — CYTOLOGY - PAP

## 2015-12-30 NOTE — Telephone Encounter (Signed)
Nothing in the note section. Closing note.

## 2016-03-24 ENCOUNTER — Ambulatory Visit
Admission: EM | Admit: 2016-03-24 | Discharge: 2016-03-24 | Disposition: A | Discharge status: Home / Self Care | Payer: BLUE CROSS/BLUE SHIELD | Attending: Emergency Medicine | Admitting: Emergency Medicine

## 2016-03-24 DIAGNOSIS — J0140 Acute pansinusitis, unspecified: Secondary | ICD-10-CM | POA: Diagnosis not present

## 2016-03-24 LAB — RAPID STREP SCREEN (MED CTR MEBANE ONLY): STREPTOCOCCUS, GROUP A SCREEN (DIRECT): NEGATIVE

## 2016-03-24 MED ORDER — AMOXICILLIN-POT CLAVULANATE 875-125 MG PO TABS: 1.0000 | ORAL_TABLET | Freq: Two times a day (BID) | ORAL | 0 refills | Status: DC

## 2016-03-24 MED ORDER — IBUPROFEN 800 MG PO TABS: 800.0000 mg | ORAL_TABLET | Freq: Three times a day (TID) | ORAL | 0 refills | Status: DC

## 2016-03-24 MED ORDER — MOMETASONE FUROATE 50 MCG/ACT NA SUSP: 2.0000 | Freq: Every day | NASAL | 0 refills | Status: DC

## 2016-03-24 NOTE — ED Provider Notes (Signed)
HPI  SUBJECTIVE:  Veronica Jenkins is a 35 y.o. female who presents with sinus pain and pressure, nasal congestion, rhinorrhea, ear pressure on the left more than right, postnasal drip, throbbing, constant maxillary headache that radiates to the back of her head starting 3 days ago. She reports body aches at first, and states that her upper teeth hurt. She reports purulent nasal drainage today. She reports sore throat, and a raspy voice, but no difficulty breathing, drooling, trismus. No fevers, nausea, vomiting, facial swelling. No sick contacts. No allergy type symptoms. She has tried Mucinex sinus -x, ayr saline spray and Tylenol without improvement. Symptoms are worse with lying down. No antibiotics in the past month. No antipyretic in the past 6-8 hours. She states that this is identical to previous sinus infections that she gets every year. She has a past medical history of sinusitis, enlarged cervical lymph nodes. No history of diabetes, hypertension. LMP: 7 days ago, denies possibility of being pregnant. PMD: Dr. Kizzie Ide at [facility] family [facility]   History reviewed. No pertinent past medical history.  Past Surgical History:  Procedure Laterality Date  . CESAREAN SECTION      Family History  Problem Relation Age of Onset  . Congestive Heart Failure Maternal Grandfather   . Heart disease Maternal Grandfather   . Diabetes Neg Hx   . Cancer Neg Hx   . Hypertension Neg Hx   . Stroke Neg Hx   . COPD Neg Hx     Social History  Substance Use Topics  . Smoking status: Former Smoker    Packs/day: 0.50    Years: 10.00    Types: Cigarettes    Quit date: 06/07/2008  . Smokeless tobacco: Never Used  . Alcohol use No     Comment: rare    No current facility-administered medications for this encounter.   Current Outpatient Prescriptions:  .  amoxicillin-clavulanate (AUGMENTIN) 875-125 MG tablet, Take 1 tablet by mouth 2 (two) times daily. X 7 days, Disp: 14 tablet, Rfl: 0 .  ibuprofen  (ADVIL,MOTRIN) 800 MG tablet, Take 1 tablet (800 mg total) by mouth 3 (three) times daily., Disp: 30 tablet, Rfl: 0 .  mometasone (NASONEX) 50 MCG/ACT nasal spray, Place 2 sprays into the nose daily., Disp: 17 g, Rfl: 0  No Known Allergies   ROS  As noted in HPI.   Physical Exam  BP 120/80 (BP Location: Right Arm)   Pulse 97   Temp 97.8 F (36.6 C) (Oral)   Resp 16   Ht  (1.575 m)   Wt 140 lb (63.5 kg)   LMP 03/14/2016 (Exact Date)   SpO2 100%   BMI 25.61 kg/m   Constitutional: Well developed, well nourished, no acute distress Eyes:  EOMI, conjunctiva normal bilaterally HENT: Normocephalic, atraumatic,mucus membranes moist. TMs normal bilaterally. + mild nasal congestion. Swollen  red turbinates.  - maxillary sinus tenderness,- frontal sinus tenderness. Dentition normal. Oropharynx slightly erythematous, positive cobblestoning, no postnasal drip.  Neck: Positive posterior cervical lymphadenopathy, patient states that this is a known and chronic issue for her. Respiratory: Normal inspiratory effort Cardiovascular: Normal rate GI: nondistended skin: No rash, skin intact Musculoskeletal: no deformities Neurologic: Alert & oriented x 3, no focal neuro deficits Psychiatric: Speech and behavior appropriate   ED Course   Medications - No data to display  Orders Placed This Encounter  Procedures  . Rapid strep screen    Standing Status:   Standing    Number of Occurrences:   1  .  Culture, group A strep    Standing Status:   Standing    Number of Occurrences:   1    Results for orders placed or performed during the hospital encounter of 03/24/16 (from the past 24 hour(s))  Rapid strep screen     Status: None   Collection Time: 03/24/16  8:44 AM  Result Value Ref Range   Streptococcus, Group A Screen (Direct) NEGATIVE NEGATIVE   No results found.  ED Clinical Impression  Acute pansinusitis, recurrence not specified   ED Assessment/Plan   Most likely  viral, however, she does have severe symptoms, so we'll send home with a wait-and-see prescription of Augmentin. Also to continue Mucinex D. Will start nasal steriods, increase fluids, nasal saline irrigation, 800 mg motrin prn pain. Discussed MDM and plan with pt. Discussed sn/sx that should prompt return to the  ED. Pt agrees with plan and will f/u with PMD prn.   *This clinic note was created using Dragon dictation software. Therefore, there may be occasional mistakes despite careful proofreading.  ?    Domenick Gong, MD 03/24/16 [phone]

## 2016-03-24 NOTE — Discharge Instructions (Signed)
Take the medication as written. You may take 800 mg of motrin with 1 gram of tylenol up to 3 times a day as needed for pain. This is an effective combination for pain.  Most sinus infections are viral and do not need antibiotics unless you have a high fever, have had this for 10 days, or you get better and then get sick again. Use a neti pot or the NeilMed sinus rinse as often as you want to to reduce nasal congestion. Follow the directions on the box.  ° °Go to www.goodrx.com to look up your medications. This will give you a list of where you can find your prescriptions at the most affordable prices.   °

## 2016-03-27 LAB — CULTURE, GROUP A STREP (THRC)

## 2016-06-13 ENCOUNTER — Encounter: Payer: Self-pay | Admitting: Family Medicine

## 2016-06-13 ENCOUNTER — Ambulatory Visit (INDEPENDENT_AMBULATORY_CARE_PROVIDER_SITE_OTHER): Payer: BLUE CROSS/BLUE SHIELD | Admitting: Family Medicine

## 2016-06-13 VITALS — BP 116/64 | HR 94 | Temp 98.1°F | Resp 14 | Ht 62.0 in | Wt 143.0 lb

## 2016-06-13 DIAGNOSIS — Z23 Encounter for immunization: Secondary | ICD-10-CM | POA: Diagnosis not present

## 2016-06-13 DIAGNOSIS — Z0000 Encounter for general adult medical examination without abnormal findings: Secondary | ICD-10-CM | POA: Diagnosis not present

## 2016-06-13 LAB — LIPID PANEL
CHOLESTEROL: 187 mg/dL (ref 125–200)
HDL: 66 mg/dL (ref >=46)
LDL CALC: 111 mg/dL (ref <130)
TRIGLYCERIDES: 49 mg/dL (ref <150)
Total CHOL/HDL Ratio: 2.8 Ratio (ref <=5.0)
VLDL: 10 mg/dL (ref <30)

## 2016-06-13 LAB — TSH: TSH: 1.7 m[IU]/L

## 2016-06-13 LAB — CBC WITH DIFFERENTIAL/PLATELET
BASOS ABS: 0 {cells}/uL (ref 0–200)
Basophils Relative: 0 %
EOS ABS: 153 {cells}/uL (ref 15–500)
EOS PCT: 3 %
HCT: 38.9 % (ref 35.0–45.0)
HEMOGLOBIN: 12.9 g/dL (ref 11.7–15.5)
LYMPHS ABS: 2397 {cells}/uL (ref 850–3900)
Lymphocytes Relative: 47 %
MCH: 28.2 pg (ref 27.0–33.0)
MCHC: 33.2 g/dL (ref 32.0–36.0)
MCV: 84.9 fL (ref 80.0–100.0)
MONOS PCT: 10 %
MPV: 9.7 fL (ref 7.5–12.5)
Monocytes Absolute: 510 cells/uL (ref 200–950)
NEUTROS PCT: 40 %
Neutro Abs: 2040 cells/uL (ref 1500–7800)
Platelets: 249 10*3/uL (ref 140–400)
RBC: 4.58 MIL/uL (ref 3.80–5.10)
RDW: 13.9 % (ref 11.0–15.0)
WBC: 5.1 10*3/uL (ref 3.8–10.8)

## 2016-06-13 LAB — COMPLETE METABOLIC PANEL WITH GFR
ALBUMIN: 4.4 g/dL (ref 3.6–5.1)
ALK PHOS: 53 U/L (ref 33–115)
ALT: 9 U/L (ref 6–29)
AST: 14 U/L (ref 10–30)
BILIRUBIN TOTAL: 0.3 mg/dL (ref 0.2–1.2)
BUN: 12 mg/dL (ref 7–25)
CO2: 27 mmol/L (ref 20–31)
Calcium: 9.5 mg/dL (ref 8.6–10.2)
Chloride: 105 mmol/L (ref 98–110)
Creat: 0.81 mg/dL (ref 0.50–1.10)
GLUCOSE: 85 mg/dL (ref 65–99)
POTASSIUM: 4.2 mmol/L (ref 3.5–5.3)
SODIUM: 140 mmol/L (ref 135–146)
TOTAL PROTEIN: 6.5 g/dL (ref 6.1–8.1)

## 2016-06-13 NOTE — Progress Notes (Signed)
Patient ID: Veronica Jenkins, female   DOB: 27-Mar-1981, 35 y.o.   MRN: [id number]   Subjective:   Veronica Jenkins is a 35 y.o. female here for a complete physical exam  Interim issues since last visit: no medical excitement  USPSTF grade A and B recommendations Alcohol: social, <7 drinks per week absolutely Depression:  Depression screen [facility] 2/9 06/13/2016 06/08/2015  Decreased Interest 0 0  Down, Depressed, Hopeless 0 0  PHQ - 2 Score 0 0   Hypertension: no Obesity: no Tobacco use: former smoker, quit in mid-20's HIV, hep B, hep C: declined STD testing and prevention (chl/gon/syphilis): declined Lipids: offered Glucose: offered Colorectal cancer: no fam hx; start at age 30 Breast cancer: sees gyn BRCA gene screening: sees gyn Intimate partner violence: no abuse Cervical cancer screening:  Sees gyn Lung cancer: n/a Osteoporosis: n/a Fall prevention/vitamin D: no vitamin D Diet: "horrible", typical American eater; loves sweets, addiction to chocolate; drinks water, coffee; not enough fruits and veggies; not much red meat Exercise: no activity really; tries to walk 30 minutes a day, but that doesn't always happen; sits all day Skin cancer: no worrisome moles  She says she needs a full CBC with differential; she was referred to ENT and wants a yearly CBC with diff; saw him regularly and has decided this is her "normal"; she is seeing him regularly as long as they don't enlarge  History reviewed. No pertinent past medical history.  MD notes: natural chickenpox, hx of shingles (age 40), measles ("three day"), hx of smoking (8 years x 1/2 ppd = 4 pack year smoking hx); hx of cervical lymphadenopathy, sees ENT periodically  Past Surgical History:  Procedure Laterality Date  . CESAREAN SECTION     Family History  Problem Relation Age of Onset  . Congestive Heart Failure Maternal Grandfather   . Heart disease Maternal Grandfather   . Diabetes Neg Hx   . Cancer Neg Hx   .  Hypertension Neg Hx   . Stroke Neg Hx   . COPD Neg Hx    Social History  Substance Use Topics  . Smoking status: Former Smoker    Packs/day: 0.50    Years: 10.00    Types: Cigarettes    Quit date: 06/07/2008  . Smokeless tobacco: Never Used  . Alcohol use No     Comment: rare   Review of Systems  Objective:   Vitals:   06/13/16 0820  BP: 116/64  Pulse: 94  Resp: 14  Temp: 98.1 F (36.7 C)  TempSrc: Oral  SpO2: 95%  Weight: 143 lb (64.9 kg)   Body mass index is 26.16 kg/m. Wt Readings from Last 3 Encounters:  06/13/16 143 lb (64.9 kg)  03/24/16 140 lb (63.5 kg)  12/27/15 139 lb 4.8 oz (63.2 kg)   Physical Exam  Constitutional: She appears well-developed and well-nourished.  HENT:  Head: Normocephalic and atraumatic.  Right Ear: Hearing, tympanic membrane, external ear and ear canal normal.  Left Ear: Hearing, tympanic membrane, external ear and ear canal normal.  Eyes: Conjunctivae and EOM are normal. Right eye exhibits no hordeolum. Left eye exhibits no hordeolum. No scleral icterus.  Neck: Carotid bruit is not present. No thyromegaly present.  Cardiovascular: Normal rate, regular rhythm, S1 normal, S2 normal and normal heart sounds.   No extrasystoles are present.  Pulmonary/Chest: Effort normal and breath sounds normal. No respiratory distress.  Abdominal: Soft. Normal appearance and bowel sounds are normal. She exhibits no distension, no abdominal  bruit, no pulsatile midline mass and no mass. There is no hepatosplenomegaly. There is no tenderness. No hernia.  Musculoskeletal: Normal range of motion. She exhibits no edema.  Lymphadenopathy:       Head (right side): No submandibular adenopathy present.       Head (left side): No submandibular adenopathy present.    She has cervical adenopathy.       Right cervical: Posterior cervical (shoddy) adenopathy present. No superficial cervical and no deep cervical adenopathy present.      Left cervical: Posterior  cervical (shoddy) adenopathy present. No superficial cervical and no deep cervical adenopathy present.  Neurological: She is alert. She displays no tremor. No cranial nerve deficit. She exhibits normal muscle tone. Gait normal.  Reflex Scores:      Patellar reflexes are 2+ on the right side and 2+ on the left side. Skin: Skin is warm and dry. No bruising and no ecchymosis noted. No cyanosis. No pallor.  Psychiatric: Her speech is normal and behavior is normal. Thought content normal. Her mood appears not anxious. She does not exhibit a depressed mood.    Assessment/Plan:   Problem List Items Addressed This Visit      Other   Preventative health care - Primary    USPSTF grade A and B recommendations reviewed with patient; age-appropriate recommendations, preventive care, screening tests, etc discussed and encouraged; healthy living encouraged; see AVS for patient education given to patient; try to incorporate more veggies; build up activity gradually; consider standing desk at work; GYN care through [employer]      Relevant Orders   Lipid panel   CBC with Differential/Platelet   COMPLETE METABOLIC PANEL WITH GFR   TSH   Needs flu shot   Relevant Orders   Flu Vaccine QUAD 36+ mos PF IM (Fluarix & Fluzone Quad PF) (Completed)    Other Visit Diagnoses   None.      No orders of the defined types were placed in this encounter.  Orders Placed This Encounter  Procedures  . Flu Vaccine QUAD 36+ mos PF IM (Fluarix & Fluzone Quad PF)  . Lipid panel  . CBC with Differential/Platelet  . COMPLETE METABOLIC PANEL WITH GFR  . TSH    Follow up plan: Return in about 1 year (around 06/13/2017) for complete physical.  An After Visit Summary was printed and given to the patient.

## 2016-06-13 NOTE — Patient Instructions (Addendum)
We'll get labs today If you have not heard anything from my staff in a week about any orders/referrals/studies from today, please contact us here to follow-up [phone]  Health Maintenance, Female Adopting a healthy lifestyle and getting preventive care can go a long way to promote health and wellness. Talk with your health care provider about what schedule of regular examinations is right for you. This is a good chance for you to check in with your provider about disease prevention and staying healthy. In between checkups, there are plenty of things you can do on your own. Experts have done a lot of research about which lifestyle changes and preventive measures are most likely to keep you healthy. Ask your health care provider for more information. WEIGHT AND DIET  Eat a healthy diet  Be sure to include plenty of vegetables, fruits, low-fat dairy products, and lean protein.  Do not eat a lot of foods high in solid fats, added sugars, or salt.  Get regular exercise. This is one of the most important things you can do for your health.  Most adults should exercise for at least 150 minutes each week. The exercise should increase your heart rate and make you sweat (moderate-intensity exercise).  Most adults should also do strengthening exercises at least twice a week. This is in addition to the moderate-intensity exercise.  Maintain a healthy weight  Body mass index (BMI) is a measurement that can be used to identify possible weight problems. It estimates body fat based on height and weight. Your health care provider can help determine your BMI and help you achieve or maintain a healthy weight.  For females 64 years of age and older:   A BMI below 18.5 is considered underweight.  A BMI of 18.5 to 24.9 is normal.  A BMI of 25 to 29.9 is considered overweight.  A BMI of 30 and above is considered obese.  Watch levels of cholesterol and blood lipids  You should start having your  blood tested for lipids and cholesterol at 36 years of age, then have this test every 5 years.  You may need to have your cholesterol levels checked more often if:  Your lipid or cholesterol levels are high.  You are older than 36 years of age.  You are at high risk for heart disease.  CANCER SCREENING   Lung Cancer  Lung cancer screening is recommended for adults 70-16 years old who are at high risk for lung cancer because of a history of smoking.  A yearly low-dose CT scan of the lungs is recommended for people who:  Currently smoke.  Have quit within the past 15 years.  Have at least a 30-pack-year history of smoking. A pack year is smoking an average of one pack of cigarettes a day for 1 year.  Yearly screening should continue until it has been 15 years since you quit.  Yearly screening should stop if you develop a health problem that would prevent you from having lung cancer treatment.  Breast Cancer  Practice breast self-awareness. This means understanding how your breasts normally appear and feel.  It also means doing regular breast self-exams. Let your health care provider know about any changes, no matter how small.  If you are in your 20s or 30s, you should have a clinical breast exam (CBE) by a health care provider every 1-3 years as part of a regular health exam.  If you are 27 or older, have a CBE every year. Also  consider having a breast X-ray (mammogram) every year.  If you have a family history of breast cancer, talk to your health care provider about genetic screening.  If you are at high risk for breast cancer, talk to your health care provider about having an MRI and a mammogram every year.  Breast cancer gene (BRCA) assessment is recommended for women who have family members with BRCA-related cancers. BRCA-related cancers include:  Breast.  Ovarian.  Tubal.  Peritoneal cancers.  Results of the assessment will determine the need for genetic  counseling and BRCA1 and BRCA2 testing. Cervical Cancer Your health care provider may recommend that you be screened regularly for cancer of the pelvic organs (ovaries, uterus, and vagina). This screening involves a pelvic examination, including checking for microscopic changes to the surface of your cervix (Pap test). You may be encouraged to have this screening done every 3 years, beginning at age 85.  For women ages 66-65, health care providers may recommend pelvic exams and Pap testing every 3 years, or they may recommend the Pap and pelvic exam, combined with testing for human papilloma virus (HPV), every 5 years. Some types of HPV increase your risk of cervical cancer. Testing for HPV may also be done on women of any age with unclear Pap test results.  Other health care providers may not recommend any screening for nonpregnant women who are considered low risk for pelvic cancer and who do not have symptoms. Ask your health care provider if a screening pelvic exam is right for you.  If you have had past treatment for cervical cancer or a condition that could lead to cancer, you need Pap tests and screening for cancer for at least 20 years after your treatment. If Pap tests have been discontinued, your risk factors (such as having a new sexual partner) need to be reassessed to determine if screening should resume. Some women have medical problems that increase the chance of getting cervical cancer. In these cases, your health care provider may recommend more frequent screening and Pap tests. Colorectal Cancer  This type of cancer can be detected and often prevented.  Routine colorectal cancer screening usually begins at 36 years of age and continues through 36 years of age.  Your health care provider may recommend screening at an earlier age if you have risk factors for colon cancer.  Your health care provider may also recommend using home test kits to check for hidden blood in the stool.  A  small camera at the end of a tube can be used to examine your colon directly (sigmoidoscopy or colonoscopy). This is done to check for the earliest forms of colorectal cancer.  Routine screening usually begins at age 41.  Direct examination of the colon should be repeated every 5-10 years through 36 years of age. However, you may need to be screened more often if early forms of precancerous polyps or small growths are found. Skin Cancer  Check your skin from head to toe regularly.  Tell your health care provider about any new moles or changes in moles, especially if there is a change in a mole's shape or color.  Also tell your health care provider if you have a mole that is larger than the size of a pencil eraser.  Always use sunscreen. Apply sunscreen liberally and repeatedly throughout the day.  Protect yourself by wearing long sleeves, pants, a wide-brimmed hat, and sunglasses whenever you are outside. HEART DISEASE, DIABETES, AND HIGH BLOOD PRESSURE   High  blood pressure causes heart disease and increases the risk of stroke. High blood pressure is more likely to develop in:  People who have blood pressure in the high end of the normal range (130-139/85-89 mm Hg).  People who are overweight or obese.  People who are African American.  If you are 46-75 years of age, have your blood pressure checked every 3-5 years. If you are 37 years of age or older, have your blood pressure checked every year. You should have your blood pressure measured twice--once when you are at a hospital or clinic, and once when you are not at a hospital or clinic. Record the average of the two measurements. To check your blood pressure when you are not at a hospital or clinic, you can use:  An automated blood pressure machine at a pharmacy.  A home blood pressure monitor.  If you are between 54 years and 18 years old, ask your health care provider if you should take aspirin to prevent strokes.  Have  regular diabetes screenings. This involves taking a blood sample to check your fasting blood sugar level.  If you are at a normal weight and have a low risk for diabetes, have this test once every three years after 36 years of age.  If you are overweight and have a high risk for diabetes, consider being tested at a younger age or more often. PREVENTING INFECTION  Hepatitis B  If you have a higher risk for hepatitis B, you should be screened for this virus. You are considered at high risk for hepatitis B if:  You were born in a country where hepatitis B is common. Ask your health care provider which countries are considered high risk.  Your parents were born in a high-risk country, and you have not been immunized against hepatitis B (hepatitis B vaccine).  You have HIV or AIDS.  You use needles to inject street drugs.  You live with someone who has hepatitis B.  You have had sex with someone who has hepatitis B.  You get hemodialysis treatment.  You take certain medicines for conditions, including cancer, organ transplantation, and autoimmune conditions. Hepatitis C  Blood testing is recommended for:  Everyone born from [birth year] through 1966.  Anyone with known risk factors for hepatitis C. Sexually transmitted infections (STIs)  You should be screened for sexually transmitted infections (STIs) including gonorrhea and chlamydia if:  You are sexually active and are younger than 36 years of age.  You are older than 36 years of age and your health care provider tells you that you are at risk for this type of infection.  Your sexual activity has changed since you were last screened and you are at an increased risk for chlamydia or gonorrhea. Ask your health care provider if you are at risk.  If you do not have HIV, but are at risk, it may be recommended that you take a prescription medicine daily to prevent HIV infection. This is called pre-exposure prophylaxis (PrEP). You are  considered at risk if:  You are sexually active and do not regularly use condoms or know the HIV status of your partner(s).  You take drugs by injection.  You are sexually active with a partner who has HIV. Talk with your health care provider about whether you are at high risk of being infected with HIV. If you choose to begin PrEP, you should first be tested for HIV. You should then be tested every 3 months for as long  as you are taking PrEP.  PREGNANCY   If you are premenopausal and you may become pregnant, ask your health care provider about preconception counseling.  If you may become pregnant, take 400 to 800 micrograms (mcg) of folic acid every day.  If you want to prevent pregnancy, talk to your health care provider about birth control (contraception). OSTEOPOROSIS AND MENOPAUSE   Osteoporosis is a disease in which the bones lose minerals and strength with aging. This can result in serious bone fractures. Your risk for osteoporosis can be identified using a bone density scan.  If you are 84 years of age or older, or if you are at risk for osteoporosis and fractures, ask your health care provider if you should be screened.  Ask your health care provider whether you should take a calcium or vitamin D supplement to lower your risk for osteoporosis.  Menopause may have certain physical symptoms and risks.  Hormone replacement therapy may reduce some of these symptoms and risks. Talk to your health care provider about whether hormone replacement therapy is right for you.  HOME CARE INSTRUCTIONS   Schedule regular health, dental, and eye exams.  Stay current with your immunizations.   Do not use any tobacco products including cigarettes, chewing tobacco, or electronic cigarettes.  If you are pregnant, do not drink alcohol.  If you are breastfeeding, limit how much and how often you drink alcohol.  Limit alcohol intake to no more than 1 drink per day for nonpregnant women. One  drink equals 12 ounces of beer, 5 ounces of wine, or 1 ounces of hard liquor.  Do not use street drugs.  Do not share needles.  Ask your health care provider for help if you need support or information about quitting drugs.  Tell your health care provider if you often feel depressed.  Tell your health care provider if you have ever been abused or do not feel safe at home.   This information is not intended to replace advice given to you by your health care provider. Make sure you discuss any questions you have with your health care provider.   Document Released: 02/26/2011 Document Revised: 09/03/2014 Document Reviewed: 07/15/2013 Elsevier Interactive Patient Education [employer].

## 2016-06-13 NOTE — Assessment & Plan Note (Signed)
USPSTF grade A and B recommendations reviewed with patient; age-appropriate recommendations, preventive care, screening tests, etc discussed and encouraged; healthy living encouraged; see AVS for patient education given to patient; try to incorporate more veggies; build up activity gradually; consider standing desk at work; [facility]

## 2016-09-03 ENCOUNTER — Encounter: Payer: Self-pay | Admitting: Family Medicine

## 2016-09-03 ENCOUNTER — Ambulatory Visit (INDEPENDENT_AMBULATORY_CARE_PROVIDER_SITE_OTHER): Payer: BLUE CROSS/BLUE SHIELD | Admitting: Family Medicine

## 2016-09-03 VITALS — BP 116/72 | HR 106 | Temp 99.2°F | Resp 18 | Ht 62.0 in | Wt 147.5 lb

## 2016-09-03 DIAGNOSIS — R059 Cough, unspecified: Secondary | ICD-10-CM

## 2016-09-03 DIAGNOSIS — R05 Cough: Secondary | ICD-10-CM

## 2016-09-03 MED ORDER — FLUTICASONE FUROATE-VILANTEROL 100-25 MCG/INH IN AEPB: 1.0000 | INHALATION_SPRAY | Freq: Every day | RESPIRATORY_TRACT | 0 refills | Status: DC

## 2016-09-03 MED ORDER — AZITHROMYCIN 250 MG PO TABS: ORAL_TABLET | ORAL | 0 refills | Status: DC

## 2016-09-03 MED ORDER — SALINE 0.65 % NA SOLN: 1.0000 | Freq: Two times a day (BID) | NASAL | 0 refills | Status: DC

## 2016-09-03 MED ORDER — BENZONATATE 100 MG PO CAPS: 100.0000 mg | ORAL_CAPSULE | Freq: Three times a day (TID) | ORAL | 0 refills | Status: DC | PRN

## 2016-09-03 NOTE — Progress Notes (Signed)
Name: Veronica Jenkins   MRN: [id number]    DOB: [birth year]   Date:09/03/2016       Progress Note  Subjective  Chief Complaint  Chief Complaint  Patient presents with  . Cough    Onset-3 weeks, unchanged-productive cough sometimes will get clear mucus-but feels like she is not getting all the mucus out. Chest heavyness and sneezing. Patient has taken Mucinex but no relief.    HPI  Cough: she had an URI three weeks ago, the nasal pressure and post-nasal drainage however still has a cough, that is still productive at times, but not always wet, no wheezing , but she has intermittent SOB and mild decrease in appetite. No fever. Tried otc medication without help. No family history or personal history of asthma, AR. Youngest daughter - 66 yo is sick also.    Patient Active Problem List   Diagnosis Date Noted  . Adolescent idiopathic scoliosis of lumbar region 09/08/2015  . Overweight (BMI 25.0-29.9) 06/08/2015    Past Surgical History:  Procedure Laterality Date  . CESAREAN SECTION      Family History  Problem Relation Age of Onset  . Congestive Heart Failure Maternal Grandfather   . Heart disease Maternal Grandfather   . Diabetes Neg Hx   . Cancer Neg Hx   . Hypertension Neg Hx   . Stroke Neg Hx   . COPD Neg Hx     Social History   Social History  . Marital status: Married    Spouse name: N/A  . Number of children: N/A  . Years of education: N/A   Occupational History  . Not on file.   Social History Main Topics  . Smoking status: Former Smoker    Packs/day: 0.50    Years: 10.00    Types: Cigarettes    Quit date: 06/07/2008  . Smokeless tobacco: Never Used  . Alcohol use No     Comment: rare  . Drug use: No  . Sexual activity: Yes   Other Topics Concern  . Not on file   Social History Narrative  . No narrative on file    No current outpatient prescriptions on file.  Allergies  Allergen Reactions  . Morphine Nausea Only and Nausea And Vomiting   Patient states she gets really sick     ROS  Ten systems reviewed and is negative except as mentioned in HPI   Objective  Vitals:   09/03/16 1357  BP: 116/72  Pulse: (!) 106  Resp: 18  Temp: 99.2 F (37.3 C)  TempSrc: Oral  SpO2: 98%  Weight: 147 lb 8 oz (66.9 kg)  Height: '5\' 2"'  (1.575 m)    Body mass index is 26.98 kg/m.  Physical Exam  Constitutional: Patient appears well-developed and well-nourished. Obese  No distress.  HEENT: head atraumatic, normocephalic, pupils equal and reactive to light, ears normal TMneck supple, throat within normal limits Cardiovascular: Normal rate, regular rhythm and normal heart sounds.  No murmur heard. No BLE edema. Pulmonary/Chest: Effort normal , mild scattered rhonchi. No respiratory distress. Abdominal: Soft.  There is no tenderness. Psychiatric: Patient has a normal mood and affect. behavior is normal. Judgment and thought content normal.  Recent Results (from the past 2160 hour(s))  Lipid panel     Status: None   Collection Time: 06/13/16  8:47 AM  Result Value Ref Range   Cholesterol 187 125 - 200 mg/dL   Triglycerides 49 <150 mg/dL   HDL 66 >=46 mg/dL  Total CHOL/HDL Ratio 2.8 <=5.0 Ratio   VLDL 10 <30 mg/dL   LDL Cholesterol 111 <130 mg/dL    Comment:   Total Cholesterol/HDL Ratio:CHD Risk                        Coronary Heart Disease Risk Table                                        Men       Women          1/2 Average Risk              3.4        3.3              Average Risk              5.0        4.4           2X Average Risk              9.6        7.1           3X Average Risk             23.4       11.0 Use the calculated Patient Ratio above and the CHD Risk table  to determine the patient's CHD Risk.   CBC with Differential/Platelet     Status: None   Collection Time: 06/13/16  8:47 AM  Result Value Ref Range   WBC 5.1 3.8 - 10.8 K/uL   RBC 4.58 3.80 - 5.10 MIL/uL   Hemoglobin 12.9 11.7 - 15.5 g/dL    HCT 38.9 35.0 - 45.0 %   MCV 84.9 80.0 - 100.0 fL   MCH 28.2 27.0 - 33.0 pg   MCHC 33.2 32.0 - 36.0 g/dL   RDW 13.9 11.0 - 15.0 %   Platelets 249 140 - 400 K/uL   MPV 9.7 7.5 - 12.5 fL   Neutro Abs 2,040 1,500 - 7,800 cells/uL   Lymphs Abs 2,397 850 - 3,900 cells/uL   Monocytes Absolute 510 200 - 950 cells/uL   Eosinophils Absolute 153 15 - 500 cells/uL   Basophils Absolute 0 0 - 200 cells/uL   Neutrophils Relative % 40 %   Lymphocytes Relative 47 %   Monocytes Relative 10 %   Eosinophils Relative 3 %   Basophils Relative 0 %   Smear Review Criteria for review not met   COMPLETE METABOLIC PANEL WITH GFR     Status: None   Collection Time: 06/13/16  8:47 AM  Result Value Ref Range   Sodium 140 135 - 146 mmol/L   Potassium 4.2 3.5 - 5.3 mmol/L   Chloride 105 98 - 110 mmol/L   CO2 27 20 - 31 mmol/L   Glucose, Bld 85 65 - 99 mg/dL   BUN 12 7 - 25 mg/dL   Creat 0.81 0.50 - 1.10 mg/dL   Total Bilirubin 0.3 0.2 - 1.2 mg/dL   Alkaline Phosphatase 53 33 - 115 U/L   AST 14 10 - 30 U/L   ALT 9 6 - 29 U/L   Total Protein 6.5 6.1 - 8.1 g/dL   Albumin 4.4 3.6 - 5.1 g/dL   Calcium 9.5 8.6 - 10.2 mg/dL   GFR, Est African American >89 >=  60 mL/min   GFR, Est Non African American >89 >=60 mL/min  TSH     Status: None   Collection Time: 06/13/16  8:47 AM  Result Value Ref Range   TSH 1.70 mIU/L    Comment:   Reference Range   > or = 20 Years  0.40-4.50   Pregnancy Range First trimester  0.26-2.66 Second trimester 0.55-2.73 Third trimester  0.43-2.91       PHQ2/9: Depression screen [facility] 2/9 09/03/2016 06/13/2016 06/08/2015  Decreased Interest 0 0 0  Down, Depressed, Hopeless 0 0 0  PHQ - 2 Score 0 0 0    Fall Risk: Fall Risk  09/03/2016 06/13/2016  Falls in the past year? No No     Functional Status Survey: Is the patient deaf or have difficulty hearing?: No Does the patient have difficulty seeing, even when wearing glasses/contacts?: No Does the patient have difficulty  concentrating, remembering, or making decisions?: No Does the patient have difficulty walking or climbing stairs?: No Does the patient have difficulty dressing or bathing?: No   Assessment & Plan  1. Cough  Discussed likely post-bronchial cough, advised to hold filling rx of antibiotics, try benzonate and Breo and if no improvement in a few days, go ahead fill rx of antibiotics.  - benzonatate (TESSALON) 100 MG capsule; Take 1-2 capsules (100-200 mg total) by mouth 3 (three) times daily as needed.  Dispense: 40 capsule; Refill: 0 - azithromycin (ZITHROMAX Z-PAK) 250 MG tablet; Take as directed  Dispense: 6 each; Refill: 0 - fluticasone furoate-vilanterol (BREO ELLIPTA) 100-25 MCG/INH AEPB; Inhale 1 puff into the lungs daily.  Dispense: 60 each; Refill: 0

## 2016-09-28 ENCOUNTER — Other Ambulatory Visit: Payer: Self-pay | Admitting: Family Medicine

## 2016-09-28 DIAGNOSIS — R059 Cough, unspecified: Secondary | ICD-10-CM

## 2016-09-28 DIAGNOSIS — R05 Cough: Secondary | ICD-10-CM

## 2017-01-10 ENCOUNTER — Ambulatory Visit
Admission: RE | Admit: 2017-01-10 | Discharge: 2017-01-10 | Disposition: A | Discharge status: Home / Self Care | Payer: BLUE CROSS/BLUE SHIELD | Source: Ambulatory Visit | Attending: Family Medicine | Admitting: Family Medicine

## 2017-01-10 ENCOUNTER — Ambulatory Visit (INDEPENDENT_AMBULATORY_CARE_PROVIDER_SITE_OTHER): Payer: BLUE CROSS/BLUE SHIELD | Admitting: Family Medicine

## 2017-01-10 ENCOUNTER — Encounter: Payer: Self-pay | Admitting: Family Medicine

## 2017-01-10 VITALS — BP 120/76 | HR 93 | Temp 98.0°F | Resp 14 | Wt 144.0 lb

## 2017-01-10 DIAGNOSIS — M41126 Adolescent idiopathic scoliosis, lumbar region: Secondary | ICD-10-CM | POA: Diagnosis not present

## 2017-01-10 DIAGNOSIS — R109 Unspecified abdominal pain: Secondary | ICD-10-CM | POA: Diagnosis not present

## 2017-01-10 DIAGNOSIS — N390 Urinary tract infection, site not specified: Secondary | ICD-10-CM

## 2017-01-10 DIAGNOSIS — R8281 Pyuria: Secondary | ICD-10-CM

## 2017-01-10 LAB — CBC WITH DIFFERENTIAL/PLATELET
BASOS ABS: 0 {cells}/uL (ref 0–200)
Basophils Relative: 0 %
EOS PCT: 1 %
Eosinophils Absolute: 67 cells/uL (ref 15–500)
HCT: 42.2 % (ref 35.0–45.0)
HEMOGLOBIN: 13.7 g/dL (ref 11.7–15.5)
LYMPHS ABS: 2747 {cells}/uL (ref 850–3900)
Lymphocytes Relative: 41 %
MCH: 28.6 pg (ref 27.0–33.0)
MCHC: 32.5 g/dL (ref 32.0–36.0)
MCV: 88.1 fL (ref 80.0–100.0)
MPV: 9.5 fL (ref 7.5–12.5)
Monocytes Absolute: 536 cells/uL (ref 200–950)
Monocytes Relative: 8 %
NEUTROS PCT: 50 %
Neutro Abs: 3350 cells/uL (ref 1500–7800)
Platelets: 217 10*3/uL (ref 140–400)
RBC: 4.79 MIL/uL (ref 3.80–5.10)
RDW: 13.2 % (ref 11.0–15.0)
WBC: 6.7 10*3/uL (ref 3.8–10.8)

## 2017-01-10 LAB — POCT URINALYSIS DIPSTICK
Bilirubin, UA: NEGATIVE
Glucose, UA: NEGATIVE
KETONES UA: NEGATIVE
Nitrite, UA: NEGATIVE
PH UA: 6.5 (ref 5.0–8.0)
PROTEIN UA: NEGATIVE
RBC UA: NEGATIVE
SPEC GRAV UA: 1.02 (ref 1.010–1.025)
Urobilinogen, UA: 0.2 E.U./dL

## 2017-01-10 LAB — COMPLETE METABOLIC PANEL WITH GFR
ALBUMIN: 4.6 g/dL (ref 3.6–5.1)
ALK PHOS: 55 U/L (ref 33–115)
ALT: 13 U/L (ref 6–29)
AST: 16 U/L (ref 10–30)
BUN: 10 mg/dL (ref 7–25)
CALCIUM: 9.5 mg/dL (ref 8.6–10.2)
CO2: 25 mmol/L (ref 20–31)
Chloride: 106 mmol/L (ref 98–110)
Creat: 0.71 mg/dL (ref 0.50–1.10)
GFR, Est African American: >89 mL/min (ref >=60)
GLUCOSE: 89 mg/dL (ref 65–99)
POTASSIUM: 4.3 mmol/L (ref 3.5–5.3)
SODIUM: 141 mmol/L (ref 135–146)
Total Bilirubin: 0.4 mg/dL (ref 0.2–1.2)
Total Protein: 6.6 g/dL (ref 6.1–8.1)

## 2017-01-10 MED ORDER — NITROFURANTOIN MONOHYD MACRO 100 MG PO CAPS: 100.0000 mg | ORAL_CAPSULE | Freq: Two times a day (BID) | ORAL | 0 refills | Status: AC

## 2017-01-10 NOTE — Patient Instructions (Signed)
Please go across the street for xrays If you have not heard anything from my staff by tomorrow (Friday) about any orders/referrals/studies from today, please contact us here to follow-up ([phone] Start the antibiotics Call the doctor on-call (me) for any major changes in your symptoms If you develop fever, nausea/vomiting, get worse, go to the emergency room

## 2017-01-10 NOTE — Progress Notes (Signed)
BP 120/76   Pulse 93   Temp 98 F (36.7 C) (Oral)   Resp 14   Wt 144 lb (65.3 kg)   LMP 12/23/2016   SpO2 96%   BMI 26.34 kg/m    Subjective:    Patient ID: Veronica Jenkins, female    DOB: April 09, 1981, 36 y.o.   MRN: [id number]  HPI: Veronica Jenkins is a 36 y.o. female  Chief Complaint  Patient presents with  . Back Pain    Right lower back pain( kidney area.) No burning,  no smell, and no frequent urination. Pain is worst when pt lie down at night    HPI She has a sore spot on the right side Going on for a week Woke up out of her sleep flat on her back The week that it first started, she fell asleep on the couch, went to bed, had some uncomfortable feeling sleeping on the right side; can sleep on the left just fine Annoying, constant pain No dark urine or blood in urine No blood in stool No fevers No unusual lifting or exercising [place] No burning with urination, no odor to urine No hx of pyelo Nothing surgical to kidney Tried tylenol, enough to warrant some pain medicine but not significant  [facility] 01/10/2017 09/03/2016 06/13/2016 06/08/2015  Decreased Interest 0 0 0 0  Down, Depressed, Hopeless 0 0 0 0  PHQ - 2 Score 0 0 0 0   Relevant past medical, surgical, family and social history reviewed No past medical history on file. Past Surgical History:  Procedure Laterality Date  . CESAREAN SECTION     Family History  Problem Relation Age of Onset  . Congestive Heart Failure Maternal Grandfather   . Heart disease Maternal Grandfather   . Diabetes Neg Hx   . Cancer Neg Hx   . Hypertension Neg Hx   . Stroke Neg Hx   . COPD Neg Hx    Social History   Social History  . Marital status: Married    Spouse name: N/A  . Number of children: N/A  . Years of education: N/A   Occupational History  . Not on file.   Social History Main Topics  . Smoking status: Former Smoker    Packs/day: 0.50    Years:  10.00    Types: Cigarettes    Quit date: 06/07/2008  . Smokeless tobacco: Never Used  . Alcohol use No     Comment: rare  . Drug use: No  . Sexual activity: Yes   Other Topics Concern  . Not on file   Social History Narrative  . No narrative on file   Interim medical history since last visit reviewed. Allergies and medications reviewed  Review of Systems Per HPI unless specifically indicated above     Objective:    BP 120/76   Pulse 93   Temp 98 F (36.7 C) (Oral)   Resp 14   Wt 144 lb (65.3 kg)   LMP 12/23/2016   SpO2 96%   BMI 26.34 kg/m   Wt Readings from Last 3 Encounters:  01/10/17 144 lb (65.3 kg)  09/03/16 147 lb 8 oz (66.9 kg)  06/13/16 143 lb (64.9 kg)    Physical Exam  Constitutional: She appears well-developed and well-nourished. No distress.  HENT:  Head: Normocephalic and atraumatic.  Eyes: EOM are normal. No scleral icterus.  Neck: No thyromegaly present.  Cardiovascular: Normal rate,  regular rhythm and normal heart sounds.   No murmur heard. Pulmonary/Chest: Effort normal and breath sounds normal. No respiratory distress. She has no wheezes.  Abdominal: Soft. Normal appearance and bowel sounds are normal. She exhibits no distension. There is no tenderness. There is no CVA tenderness.  Musculoskeletal: Normal range of motion. She exhibits no edema.       Thoracic back: She exhibits normal range of motion and no deformity.       Lumbar back: She exhibits normal range of motion and no deformity.  Neurological: She is alert. She exhibits normal muscle tone.  Skin: Skin is warm and dry. No rash (specifically, no rash indicative of shingles over area of discomfort) noted. She is not diaphoretic. No pallor.  Psychiatric: She has a normal mood and affect. Her behavior is normal. Judgment and thought content normal.   Urine dip: 3+ leukocyte esterase     Assessment & Plan:   Problem List Items Addressed This Visit      Musculoskeletal and  Integument   Adolescent idiopathic scoliosis of lumbar region    Noted, but patient is recent, with 3+ LE; taking into account her musculoskeletal issue, but will treat for presumptive UTI       Other Visit Diagnoses    Flank pain    -  Primary   with 3+ LE on urine dip; culture pending; no blood; discussed ddx; will start ABX; get KUB, labs; reasons to seek immed care reviewed   Relevant Orders   POCT urinalysis dipstick (Completed)   CBC with Differential/Platelet (Completed)   Urine Culture (Completed)   COMPLETE METABOLIC PANEL WITH GFR (Completed)   DG Abd 1 View (Completed)   Pyuria       urine culture pending; discusssed ddx, start ABX; contact me for changes (I am on-call until Monday morning)   Relevant Orders   Urine Culture (Completed)   DG Abd 1 View (Completed)       Follow up plan: No Follow-up on file.  An after-visit summary was printed and given to the patient at check-out.  Please see the patient instructions which may contain other information and recommendations beyond what is mentioned above in the assessment and plan.  Meds ordered this encounter  Medications  . flintstones complete (FLINTSTONES) 60 MG chewable tablet    Sig: Chew 1 tablet by mouth daily.  . nitrofurantoin, macrocrystal-monohydrate, (MACROBID) 100 MG capsule    Sig: Take 1 capsule (100 mg total) by mouth 2 (two) times daily.    Dispense:  6 capsule    Refill:  0    Orders Placed This Encounter  Procedures  . Urine Culture  . DG Abd 1 View  . CBC with Differential/Platelet  . COMPLETE METABOLIC PANEL WITH GFR  . POCT urinalysis dipstick

## 2017-01-11 LAB — URINE CULTURE: Organism ID, Bacteria: NO GROWTH

## 2017-01-12 NOTE — Assessment & Plan Note (Signed)
Noted, but patient is recent, with 3+ LE; taking into account her musculoskeletal issue, but will treat for presumptive UTI

## 2017-02-19 ENCOUNTER — Encounter: Payer: Self-pay | Admitting: Family Medicine

## 2017-02-19 ENCOUNTER — Ambulatory Visit (INDEPENDENT_AMBULATORY_CARE_PROVIDER_SITE_OTHER): Payer: BLUE CROSS/BLUE SHIELD | Admitting: Family Medicine

## 2017-02-19 VITALS — BP 118/84 | HR 100 | Temp 98.1°F | Resp 16 | Ht 62.0 in | Wt 143.4 lb

## 2017-02-19 DIAGNOSIS — M25551 Pain in right hip: Secondary | ICD-10-CM | POA: Diagnosis not present

## 2017-02-19 NOTE — Progress Notes (Addendum)
Name: Veronica Jenkins   MRN: [id number]    DOB: 31-Jul-1981   Date:02/19/2017       Progress Note  Subjective  Chief Complaint  Chief Complaint  Patient presents with  . Hip Pain    right hip pain for 1 week    HPI  Pt presents with 1 week history of RIGHT hip pain.  Was here 1 month ago for back/kidney pain, this went away and now she is having RIGHT hip pain that is diffuse across anterior and posterior that is progressively worsening. Worse while laying down and sitting, also has some pain while standing. Has difficulty finding a comfortable position at night to sleep.  Occasionally walks with limp due to pain. Position has not been making it better, has not tried tylenol or NSAIDS for pain, no ice or stretching. No recent injury or overuse.  Did go up to [facility] 2 weeks ago - drove about 9 hours both ways.  No hip or calf redness or swelling, no abdominal pain or low back pain, no fevers/chills, shortness of breath, chest pain, urinary frequency or dysuria.  No numbness or tingling. Pain level is 2/10 and is described as discomfort.  Patient Active Problem List   Diagnosis Date Noted  . Adolescent idiopathic scoliosis of lumbar region 09/08/2015  . Overweight (BMI 25.0-29.9) 06/08/2015    Social History  Substance Use Topics  . Smoking status: Former Smoker    Packs/day: 0.50    Years: 10.00    Types: Cigarettes    Quit date: 06/07/2008  . Smokeless tobacco: Never Used  . Alcohol use No     Comment: rare     Current Outpatient Prescriptions:  .  flintstones complete (FLINTSTONES) 60 MG chewable tablet, Chew 1 tablet by mouth daily., Disp: , Rfl:   Allergies  Allergen Reactions  . Morphine Nausea Only and Nausea And Vomiting    Patient states she gets really sick    ROS  Ten systems reviewed and is negative except as mentioned in HPI  Objective  Vitals:   02/19/17 1004  BP: 118/84  Pulse: 100  Resp: 16  Temp: 98.1 F (36.7 C)  TempSrc: Oral  SpO2: 96%   Weight: 143 lb 6.4 oz (65 kg)  Height: 5\' 2"  (1.575 m)    Body mass index is 26.23 kg/m.  Nursing Note and Vital Signs reviewed.  Physical Exam  Constitutional: Patient appears well-developed and well-nourished.  No distress.  HEENT: head atraumatic, normocephalic Cardiovascular: Normal rate, regular rhythm, S1/S2 present.  No murmur or rub heard. No BLE edema. Pulmonary/Chest: Effort normal and breath sounds clear. No respiratory distress or retractions. Psychiatric: Patient has a normal mood and affect. behavior is normal. Judgment and thought content normal. Musculoskeletal: Normal range of motion, no joint effusions. No gross deformities, no tenderness on palpation. Pedal pulses +2 bilaterally.  Neurological: she is alert and oriented to person, place, and time.  Coordination, balance, strength, speech and gait are normal.    Recent Results (from the past 2160 hour(s))  POCT urinalysis dipstick     Status: Abnormal   Collection Time: 01/10/17 10:39 AM  Result Value Ref Range   Color, UA yellow    Clarity, UA clear    Glucose, UA neg    Bilirubin, UA neg    Ketones, UA neg    Spec Grav, UA 1.020 1.010 - 1.025   Blood, UA neg    pH, UA 6.5 5.0 - 8.0   Protein,  UA neg    Urobilinogen, UA 0.2 0.2 or 1.0 E.U./dL   Nitrite, UA neg    Leukocytes, UA Large (3+) (A) Negative  CBC with Differential/Platelet     Status: None   Collection Time: 01/10/17 11:36 AM  Result Value Ref Range   WBC 6.7 3.8 - 10.8 K/uL   RBC 4.79 3.80 - 5.10 MIL/uL   Hemoglobin 13.7 11.7 - 15.5 g/dL   HCT 42.2 35.0 - 45.0 %   MCV 88.1 80.0 - 100.0 fL   MCH 28.6 27.0 - 33.0 pg   MCHC 32.5 32.0 - 36.0 g/dL   RDW 13.2 11.0 - 15.0 %   Platelets 217 140 - 400 K/uL   MPV 9.5 7.5 - 12.5 fL   Neutro Abs 3,350 1,500 - 7,800 cells/uL   Lymphs Abs 2,747 850 - 3,900 cells/uL   Monocytes Absolute 536 200 - 950 cells/uL   Eosinophils Absolute 67 15 - 500 cells/uL   Basophils Absolute 0 0 - 200 cells/uL    Neutrophils Relative % 50 %   Lymphocytes Relative 41 %   Monocytes Relative 8 %   Eosinophils Relative 1 %   Basophils Relative 0 %   Smear Review Criteria for review not met   COMPLETE METABOLIC PANEL WITH GFR     Status: None   Collection Time: 01/10/17 11:36 AM  Result Value Ref Range   Sodium 141 135 - 146 mmol/L   Potassium 4.3 3.5 - 5.3 mmol/L   Chloride 106 98 - 110 mmol/L   CO2 25 20 - 31 mmol/L   Glucose, Bld 89 65 - 99 mg/dL   BUN 10 7 - 25 mg/dL   Creat 0.71 0.50 - 1.10 mg/dL   Total Bilirubin 0.4 0.2 - 1.2 mg/dL   Alkaline Phosphatase 55 33 - 115 U/L   AST 16 10 - 30 U/L   ALT 13 6 - 29 U/L   Total Protein 6.6 6.1 - 8.1 g/dL   Albumin 4.6 3.6 - 5.1 g/dL   Calcium 9.5 8.6 - 10.2 mg/dL   GFR, Est African American >89 >=60 mL/min   GFR, Est Non African American >89 >=60 mL/min  Urine Culture     Status: None   Collection Time: 01/10/17 11:56 AM  Result Value Ref Range   Organism ID, Bacteria NO GROWTH      Assessment & Plan  1. Right hip pain - Ambulatory referral to Orthopedic Surgery - Pt declines Rx for NSAIDS, states will take Aleve or Tylenol PRN for pain while awaiting Ortho consult. - Discussed gentle stretching and strengthening as tolerated, may ice PRN for pain -Red flags and when to present for emergency care or RTC including fever >101.74F, chest pain, shortness of breath, new/worsening/un-resolving symptoms, swelling or redness to area reviewed with patient at time of visit. Follow up and care instructions discussed and provided in AVS.  I have reviewed this encounter including the documentation in this note and/or discussed this patient with the Johney Maine, FNP, NP-C. I am certifying that I agree with the content of this note as supervising physician.  Steele Sizer, MD [facility] 02/19/2017, 2:49 PM

## 2017-02-19 NOTE — Patient Instructions (Addendum)
Hip Pain The hip is the joint between the upper legs and the lower pelvis. The bones, cartilage, tendons, and muscles of your hip joint support your body and allow you to move around. Hip pain can range from a minor ache to severe pain in one or both of your hips. The pain may be felt on the inside of the hip joint near the groin, or the outside near the buttocks and upper thigh. You may also have swelling or stiffness. Follow these instructions at home: Managing pain, stiffness, and swelling  If directed, apply ice to the injured area. ? Put ice in a plastic bag. ? Place a towel between your skin and the bag. ? Leave the ice on for 20 minutes, 2-3 times a day  Sleep with a pillow between your legs on your most comfortable side.  Avoid any activities that cause pain. General instructions  Take over-the-counter and prescription medicines only as told by your health care provider.  Do any exercises as told by your health care provider.  Record the following: ? How often you have hip pain. ? The location of your pain. ? What the pain feels like. ? What makes the pain worse.  Keep all follow-up visits as told by your health care provider. This is important. Contact a health care provider if:  You cannot put weight on your leg.  Your pain or swelling continues or gets worse after one week.  It gets harder to walk.  You have a fever. Get help right away if:  You fall.  You have a sudden increase in pain and swelling in your hip.  Your hip is red or swollen or very tender to touch. Summary  Hip pain can range from a minor ache to severe pain in one or both of your hips.  The pain may be felt on the inside of the hip joint near the groin, or the outside near the buttocks and upper thigh.  Avoid any activities that cause pain.  Record how often you have hip pain, the location of the pain, what makes it worse and what it feels like. This information is not intended to  replace advice given to you by your health care provider. Make sure you discuss any questions you have with your health care provider. Document Released: 01/31/2010 Document Revised: 07/16/2016 Document Reviewed: 07/16/2016 Elsevier Interactive Patient Education  2017 [employer]  Hip Exercises Ask your health care provider which exercises are safe for you. Do exercises exactly as told by your health care provider and adjust them as directed. It is normal to feel mild stretching, pulling, tightness, or discomfort as you do these exercises, but you should stop right away if you feel sudden pain or your pain gets worse.Do not begin these exercises until told by your health care provider. STRETCHING AND RANGE OF MOTION EXERCISES These exercises warm up your muscles and joints and improve the movement and flexibility of your hip. These exercises also help to relieve pain, numbness, and tingling. Exercise A: Hamstrings, Supine  1. Lie on your back. 2. Loop a belt or towel over the ball of your left / rightfoot. The ball of your foot is on the walking surface, right under your toes. 3. Straighten your left / rightknee and slowly pull on the belt to raise your leg. ? Do not let your left / right knee bend while you do this. ? Keep your other leg flat on the floor. ? Raise the left /  right leg until you feel a gentle stretch behind your left / right knee or thigh. 4. Hold this position for __________ seconds. 5. Slowly return your leg to the starting position. Repeat __________ times. Complete this stretch __________ times a day. Exercise B: Hip Rotators  1. Lie on your back on a firm surface. 2. Hold your left / right knee with your left / right hand. Hold your ankle with your other hand. 3. Gently pull your left / right knee and rotate your lower leg toward your other shoulder. ? Pull until you feel a stretch in your buttocks. ? Keep your hips and shoulders firmly planted while you do this  stretch. 4. Hold this position for __________ seconds. Repeat __________ times. Complete this stretch __________ times a day. Exercise C: V-Sit (Hamstrings and Adductors)  1. Sit on the floor with your legs extended in a large "V" shape. Keep your knees straight during this exercise. 2. Start with your head and chest upright, then bend at your waist to reach for your left foot (position A). You should feel a stretch in your right inner thigh. 3. Hold this position for __________ seconds. Then slowly return to the upright position. 4. Bend at your waist to reach forward (position B). You should feel a stretch behind both of your thighs and knees. 5. Hold this position for __________ seconds. Then slowly return to the upright position. 6. Bend at your waist to reach for your right foot (position C). You should feel a stretch in your left inner thigh. 7. Hold this position for __________ seconds. Then slowly return to the upright position. Repeat __________ times. Complete this stretch __________ times a day. Exercise D: Lunge (Hip Flexors)  1. Place your left / right knee on the floor and bend your other knee so that is directly over your ankle. You should be half-kneeling. 2. Keep good posture with your head over your shoulders. 3. Tighten your buttocks to point your tailbone downward. This helps your back to keep from arching too much. 4. You should feel a gentle stretch in the front of your left / right thigh and hip. If you do not feel any resistance, slightly slide your other foot forward and then slowly lunge forward so your knee once again lines up over your ankle. 5. Make sure your tailbone continues to point downward. 6. Hold this position for __________ seconds. Repeat __________ times. Complete this stretch __________ times a day. STRENGTHENING EXERCISES These exercises build strength and endurance in your hip. Endurance is the ability to use your muscles for a long time, even after  they get tired. Exercise E: Bridge (Hip Extensors)  1. Lie on your back on a firm surface with your knees bent and your feet flat on the floor. 2. Tighten your buttocks muscles and lift your bottom off the floor until the trunk of your body is level with your thighs. ? Do not arch your back. ? You should feel the muscles working in your buttocks and the back of your thighs. If you do not feel these muscles, slide your feet 1-2 inches (2.5-5 cm) farther away from your buttocks. 3. Hold this position for __________ seconds. 4. Slowly lower your hips to the starting position. 5. Let your muscles relax completely between repetitions. 6. If this exercise is too easy, try doing it with your arms crossed over your chest. Repeat __________ times. Complete this exercise __________ times a day. Exercise F: Straight Leg Raises - Hip  Abductors  1. Lie on your side with your left / right leg in the top position. Lie so your head, shoulder, knee, and hip line up with each other. You may bend your bottom knee to help you balance. 2. Roll your hips slightly forward, so your hips are stacked directly over each other and your left / right knee is facing forward. 3. Leading with your heel, lift your top leg 4-6 inches (10-15 cm). You should feel the muscles in your outer hip lifting. ? Do not let your foot drift forward. ? Do not let your knee roll toward the ceiling. 4. Hold this position for __________ seconds. 5. Slowly return to the starting position. 6. Let your muscles relax completely between repetitions. Repeat __________ times. Complete this exercise __________ times a day. Exercise G: Straight Leg Raises - Hip Adductors  1. Lie on your side with your left / right leg in the bottom position. Lie so your head, shoulder, knee, and hip line up. You may place your upper foot in front to help you balance. 2. Roll your hips slightly forward, so your hips are stacked directly over each other and your left /  right knee is facing forward. 3. Tense the muscles in your inner thigh and lift your bottom leg 4-6 inches (10-15 cm). 4. Hold this position for __________ seconds. 5. Slowly return to the starting position. 6. Let your muscles relax completely between repetitions. Repeat __________ times. Complete this exercise __________ times a day. Exercise H: Straight Leg Raises - Quadriceps  1. Lie on your back with your left / right leg extended and your other knee bent. 2. Tense the muscles in the front of your left / right thigh. When you do this, you should see your kneecap slide up or see increased dimpling just above your knee. 3. Tighten these muscles even more and raise your leg 4-6 inches (10-15 cm) off the floor. 4. Hold this position for __________ seconds. 5. Keep these muscles tense as you lower your leg. 6. Relax the muscles slowly and completely between repetitions. Repeat __________ times. Complete this exercise __________ times a day. Exercise I: Hip Abductors, Standing 1. Tie one end of a rubber exercise band or tubing to a secure surface, such as a table or pole. 2. Loop the other end of the band or tubing around your left / right ankle. 3. Keeping your ankle with the band or tubing directly opposite of the secured end, step away until there is tension in the tubing or band. Hold onto a chair as needed for balance. 4. Lift your left / right leg out to your side. While you do this: ? Keep your back upright. ? Keep your shoulders over your hips. ? Keep your toes pointing forward. ? Make sure to use your hip muscles to lift your leg. Do not "throw" your leg or tip your body to lift your leg. 5. Hold this position for __________ seconds. 6. Slowly return to the starting position. Repeat __________ times. Complete this exercise __________ times a day. Exercise J: Squats (Quadriceps) 1. Stand in a door frame so your feet and knees are in line with the frame. You may place your hands on  the frame for balance. 2. Slowly bend your knees and lower your hips like you are going to sit in a chair. ? Keep your lower legs in a straight-up-and-down position. ? Do not let your hips go lower than your knees. ? Do not bend your  knees lower than told by your health care provider. ? If your hip pain increases, do not bend as low. 3. Hold this position for ___________ seconds. 4. Slowly push with your legs to return to standing. Do not use your hands to pull yourself to standing. Repeat __________ times. Complete this exercise __________ times a day. This information is not intended to replace advice given to you by your health care provider. Make sure you discuss any questions you have with your health care provider. Document Released: 08/31/2005 Document Revised: 05/07/2016 Document Reviewed: 08/08/2015 Elsevier Interactive Patient Education  [employer].

## 2017-02-28 ENCOUNTER — Ambulatory Visit (INDEPENDENT_AMBULATORY_CARE_PROVIDER_SITE_OTHER): Payer: BLUE CROSS/BLUE SHIELD | Admitting: Obstetrics and Gynecology

## 2017-02-28 ENCOUNTER — Encounter: Payer: Self-pay | Admitting: Obstetrics and Gynecology

## 2017-02-28 VITALS — BP 138/84 | HR 79 | Ht 62.0 in | Wt 143.9 lb

## 2017-02-28 DIAGNOSIS — Z01419 Encounter for gynecological examination (general) (routine) without abnormal findings: Secondary | ICD-10-CM

## 2017-02-28 NOTE — Progress Notes (Signed)
Subjective:   Veronica Jenkins is a 36 y.o. G2P2 Caucasian female here for a routine well-woman exam.  Patient's last menstrual period was 02/11/2017.    Current complaints: none PCP: Lada       Doesn't need labs-PCP did them in May 2018  Social History: Sexual: heterosexual Marital Status: married Living situation: with family Occupation: homemaker Tobacco/alcohol: no tobacco use Illicit drugs: no history of illicit drug use  The following portions of the patient's history were reviewed and updated as appropriate: allergies, current medications, past family history, past medical history, past social history, past surgical history and problem list.  Past Medical History History reviewed. No pertinent past medical history.  Past Surgical History Past Surgical History:  Procedure Laterality Date  . CESAREAN SECTION      Gynecologic History G2P2  Patient's last menstrual period was 02/11/2017. Contraception: vasectomy Last Pap: 2017. Results were: normal   Obstetric History OB History  Gravida Para Term Preterm AB Living  2 2          SAB TAB Ectopic Multiple Live Births               # Outcome Date GA Lbr Len/2nd Weight Sex Delivery Anes PTL Lv  2 Para 2011    F CS-Unspec     1 Para 2008    M CS-Unspec         Current Medications Current Outpatient Prescriptions on File Prior to Visit  Medication Sig Dispense Refill  . flintstones complete (FLINTSTONES) 60 MG chewable tablet Chew 1 tablet by mouth daily.     No current facility-administered medications on file prior to visit.     Review of Systems Patient denies any headaches, blurred vision, shortness of breath, chest pain, abdominal pain, problems with bowel movements, urination, or intercourse.  Objective:  BP 138/84   Pulse 79   Ht 5\' 2"  (1.575 m)   Wt 143 lb 14.4 oz (65.3 kg)   LMP 02/11/2017   BMI 26.32 kg/m  Physical Exam  General:  Well developed, well nourished, no acute distress. She is alert  and oriented x3. Skin:  Warm and dry Neck:  Midline trachea, no thyromegaly or nodules Cardiovascular: Regular rate and rhythm, no murmur heard Lungs:  Effort normal, all lung fields clear to auscultation bilaterally Breasts:  No dominant palpable mass, retraction, or nipple discharge Abdomen:  Soft, non tender, no hepatosplenomegaly or masses Pelvic:  External genitalia is normal in appearance.  The vagina is normal in appearance. The cervix is bulbous, no CMT.  Thin prep pap is not done . Uterus is felt to be normal size, shape, and contour.  No adnexal masses or tenderness noted.  Extremities:  No swelling or varicosities noted Psych:  She has a normal mood and affect  Assessment:   Healthy well-woman exam overweight  Plan:   F/U 1 year for AE, or sooner if needed   Melody Suzan NailerN Shambley, CNM

## 2017-02-28 NOTE — Patient Instructions (Signed)
Preventive Care 18-39 Years, Female Preventive care refers to lifestyle choices and visits with your health care provider that can promote health and wellness. What does preventive care include?  A yearly physical exam. This is also called an annual well check.  Dental exams once or twice a year.  Routine eye exams. Ask your health care provider how often you should have your eyes checked.  Personal lifestyle choices, including: ? Daily care of your teeth and gums. ? Regular physical activity. ? Eating a healthy diet. ? Avoiding tobacco and drug use. ? Limiting alcohol use. ? Practicing safe sex. ? Taking vitamin and mineral supplements as recommended by your health care provider. What happens during an annual well check? The services and screenings done by your health care provider during your annual well check will depend on your age, overall health, lifestyle risk factors, and family history of disease. Counseling Your health care provider may ask you questions about your:  Alcohol use.  Tobacco use.  Drug use.  Emotional well-being.  Home and relationship well-being.  Sexual activity.  Eating habits.  Work and work Statistician.  Method of birth control.  Menstrual cycle.  Pregnancy history.  Screening You may have the following tests or measurements:  Height, weight, and BMI.  Diabetes screening. This is done by checking your blood sugar (glucose) after you have not eaten for a while (fasting).  Blood pressure.  Lipid and cholesterol levels. These may be checked every 5 years starting at age 38.  Skin check.  Hepatitis C blood test.  Hepatitis B blood test.  Sexually transmitted disease (STD) testing.  BRCA-related cancer screening. This may be done if you have a family history of breast, ovarian, tubal, or peritoneal cancers.  Pelvic exam and Pap test. This may be done every 3 years starting at age 38. Starting at age 30, this may be done  every 5 years if you have a Pap test in combination with an HPV test.  Discuss your test results, treatment options, and if necessary, the need for more tests with your health care provider. Vaccines Your health care provider may recommend certain vaccines, such as:  Influenza vaccine. This is recommended every year.  Tetanus, diphtheria, and acellular pertussis (Tdap, Td) vaccine. You may need a Td booster every 10 years.  Varicella vaccine. You may need this if you have not been vaccinated.  HPV vaccine. If you are 39 or younger, you may need three doses over 6 months.  Measles, mumps, and rubella (MMR) vaccine. You may need at least one dose of MMR. You may also need a second dose.  Pneumococcal 13-valent conjugate (PCV13) vaccine. You may need this if you have certain conditions and were not previously vaccinated.  Pneumococcal polysaccharide (PPSV23) vaccine. You may need one or two doses if you smoke cigarettes or if you have certain conditions.  Meningococcal vaccine. One dose is recommended if you are age 68-21 years and a first-year college student living in a residence hall, or if you have one of several medical conditions. You may also need additional booster doses.  Hepatitis A vaccine. You may need this if you have certain conditions or if you travel or work in places where you may be exposed to hepatitis A.  Hepatitis B vaccine. You may need this if you have certain conditions or if you travel or work in places where you may be exposed to hepatitis B.  Haemophilus influenzae type b (Hib) vaccine. You may need this  if you have certain risk factors.  Talk to your health care provider about which screenings and vaccines you need and how often you need them. This information is not intended to replace advice given to you by your health care provider. Make sure you discuss any questions you have with your health care provider. Document Released: 10/09/2001 Document Revised:  05/02/2016 Document Reviewed: 06/14/2015 Elsevier Interactive Patient Education  2017 Elsevier Inc.  

## 2017-04-24 ENCOUNTER — Encounter: Payer: Self-pay | Admitting: Family Medicine

## 2017-04-26 ENCOUNTER — Encounter: Payer: Self-pay | Admitting: Family Medicine

## 2017-04-26 ENCOUNTER — Ambulatory Visit (INDEPENDENT_AMBULATORY_CARE_PROVIDER_SITE_OTHER): Payer: BLUE CROSS/BLUE SHIELD | Admitting: Family Medicine

## 2017-04-26 VITALS — BP 133/74 | HR 90 | Temp 98.6°F | Resp 17 | Ht 62.0 in | Wt 145.4 lb

## 2017-04-26 DIAGNOSIS — M41126 Adolescent idiopathic scoliosis, lumbar region: Secondary | ICD-10-CM | POA: Diagnosis not present

## 2017-04-26 DIAGNOSIS — R109 Unspecified abdominal pain: Secondary | ICD-10-CM

## 2017-04-26 DIAGNOSIS — M25551 Pain in right hip: Secondary | ICD-10-CM | POA: Diagnosis not present

## 2017-04-26 DIAGNOSIS — N941 Unspecified dyspareunia: Secondary | ICD-10-CM

## 2017-04-26 LAB — POCT WET PREP (WET MOUNT)

## 2017-04-26 LAB — POCT URINALYSIS DIPSTICK
Bilirubin, UA: NEGATIVE
GLUCOSE UA: NEGATIVE
Ketones, UA: NEGATIVE
LEUKOCYTES UA: NEGATIVE
NITRITE UA: NEGATIVE
Protein, UA: NEGATIVE
RBC UA: NEGATIVE
Spec Grav, UA: 1.015 (ref 1.010–1.025)
UROBILINOGEN UA: NEGATIVE U/dL — AB
pH, UA: 5 (ref 5.0–8.0)

## 2017-04-26 MED ORDER — MELOXICAM 15 MG PO TABS: 7.5000 mg | ORAL_TABLET | Freq: Every day | ORAL | 0 refills | Status: DC

## 2017-04-26 NOTE — Progress Notes (Addendum)
Name: Veronica Jenkins   MRN: [id number]    DOB: 07/04/1981   Date:04/26/2017       Progress Note  Subjective  Chief Complaint  Chief Complaint  Patient presents with  . Hip Pain    rt hip x2 weeks  . Abdominal Pain    this morning     HPI  Patient presents with 2 weeks of RIGHT hip pain. She was seen recently for this issue 02/19/2017 - was treated and the pain went away after stretching/exercises so she did not go to her Orthopedic follow up.  She describes the pain as constant, aching, discomfort that wakes her up at night; sometimes burning and sharp; occasionally radiates to the right lower back.  She wants to assess for any urologic/gynecologic issues before following up with Ortho.  Does have history of scoliosis and low back pain.  Abdominal pain:  Abdominal pain on right ride that began today and lasted for about 2 hours. After her menses ended this month, she had some bright red spotting each time she wiped for about 2 weeks. Not currently using birth control. Endorses pain with intercourse after this - pain 8-9/10 pain, a few days later tried to have intercourse again and still had significant discomfort during intercourse.  No abnormal vaginal discharge or odor.  She saw Ms. Shambley, CNM in July, but was not having symptoms then.  Monogamous with her husband, but requests gonorrhea/chlamydia testing.  Patient Active Problem List   Diagnosis Date Noted  . Adolescent idiopathic scoliosis of lumbar region 09/08/2015  . Overweight (BMI 25.0-29.9) 06/08/2015    Social History  Substance Use Topics  . Smoking status: Former Smoker    Packs/day: 0.50    Years: 10.00    Types: Cigarettes    Quit date: 06/07/2008  . Smokeless tobacco: Never Used  . Alcohol use No     Comment: rare     Current Outpatient Prescriptions:  .  flintstones complete (FLINTSTONES) 60 MG chewable tablet, Chew 1 tablet by mouth daily., Disp: , Rfl:  .  meloxicam (MOBIC) 15 MG tablet, Take 0.5-1  tablets (7.5-15 mg total) by mouth daily., Disp: 30 tablet, Rfl: 0  Allergies  Allergen Reactions  . Morphine Nausea Only and Nausea And Vomiting    Patient states she gets really sick    ROS  Ten systems reviewed and is negative except as mentioned in HPI  Objective  Vitals:   04/26/17 1335 04/26/17 1520  BP: 133/74   Pulse: (!) 110 90  Resp: 17   Temp: 98.6 F (37 C)   TempSrc: Oral   SpO2: 99%   Weight: 145 lb 6.4 oz (66 kg)   Height: 5\' 2"  (1.575 m)    Body mass index is 26.59 kg/m.  Nursing Note and Vital Signs reviewed. LMP: 04/03/2017  Physical Exam  Constitutional: Patient appears well-developed and well-nourished. No distress.  HEENT: head atraumatic, normocephalic Cardiovascular: Normal rate, regular rhythm, S1/S2 present.  No murmur or rub heard. No BLE edema. Pulmonary/Chest: Effort normal and breath sounds clear. No respiratory distress or retractions. Abdominal: Soft and non-tender, bowel sounds present x4 quadrants.  No CVA Tenderness Psychiatric: Patient has a normal mood and affect. behavior is normal. Judgment and thought content normal. FEMALE GENITALIA:  External genitalia normal External urethra normal Vaginal vault normal without discharge or lesions Cervix normal without discharge or lesions Bimanual exam normal without masses RECTAL: external inspection finds no rectal masses or hemorrhoids Musculoskeletal: Normal range of motion,  no joint effusions. Full AROM of Spine and bilateral Hips. No bony tenderness. Spine shows slight lumbar curve consistent with adolescent scoliosis. Neurological: she is alert and oriented to person, place, and time. No cranial nerve deficit. Coordination, balance, strength, speech and gait are normal.   Recent Results (from the past 2160 hour(s))  POCT Wet Prep Mellody Drown [place])     Status: Abnormal   Collection Time: 04/26/17  2:24 PM  Result Value Ref Range   Source Wet Prep POC Vaginal    WBC, Wet Prep HPF POC Few     Bacteria Wet Prep HPF POC Few Few   BACTERIA WET PREP MORPHOLOGY POC     Clue Cells Wet Prep HPF POC Moderate (A) None   Clue Cells Wet Prep Whiff POC     Yeast Wet Prep HPF POC Few    KOH Wet Prep POC     Trichomonas Wet Prep HPF POC  Absent  POCT urinalysis dipstick     Status: Abnormal   Collection Time: 04/26/17  3:21 PM  Result Value Ref Range   Color, UA     Clarity, UA     Glucose, UA neg    Bilirubin, UA neg    Ketones, UA neg    Spec Grav, UA 1.015 1.010 - 1.025   Blood, UA neg    pH, UA 5.0 5.0 - 8.0   Protein, UA neg    Urobilinogen, UA negative (A) 0.2 or 1.0 E.U./dL   Nitrite, UA neg    Leukocytes, UA Negative Negative    Assessment & Plan  1. Right sided abdominal pain - GC/Chlamydia Probe Amp - WET PREP BY MOLECULAR PROBE - advised patient that there are moderate amount of clue cells, but lack of physical exam findings and other symptoms suggests that treatment is not warranted at this time - POCT urinalysis dipstick - Negative  2. Dyspareunia, female - GC/Chlamydia Probe Amp - WET PREP BY MOLECULAR PROBE - advised patient that there are moderate amount of clue cells, but lack of physical exam findings and other symptoms suggests that treatment is not warranted at this time - POCT urinalysis dipstick - negative - Advised patient make appointment with her CNM, Melody Shambley, when possible for further evaluation.  3. Right hip pain - meloxicam (MOBIC) 15 MG tablet; Take 0.5-1 tablets (7.5-15 mg total) by mouth daily.  Dispense: 30 tablet; Refill: 0 - Meloxicam PRN, continue exercises and stretching as discussed.  4. Adolescent idiopathic scoliosis of lumbar region - meloxicam (MOBIC) 15 MG tablet; Take 0.5-1 tablets (7.5-15 mg total) by mouth daily.  Dispense: 30 tablet; Refill: 0  -Red flags and when to present for emergency care or RTC including fever >101.35F, severe pain, extremity weakness or numbness/tingling, new/worsening/un-resolving symptoms,  reviewed with patient at time of visit. Follow up and care instructions discussed and provided in AVS.  I have reviewed this encounter including the documentation in this note and/or discussed this patient with the Deboraha Sprang, FNP, NP-C. I am certifying that I agree with the content of this note as supervising physician.  Alba Cory, MD [facility] 04/26/2017, 5:02 PM

## 2017-04-26 NOTE — Patient Instructions (Addendum)
Please Call Ms. Melody Shambley CNM to schedule a follow up for as soon as possible.  We will call you with your results once available.  Please perform gentle stretching/strengthening for your hip and back as discussed.  Take Meloxicam as needed for pain.

## 2017-04-27 LAB — GC/CHLAMYDIA PROBE AMP
CT PROBE, AMP APTIMA: NOT DETECTED
GC Probe RNA: NOT DETECTED

## 2017-04-30 ENCOUNTER — Encounter: Payer: Self-pay | Admitting: Obstetrics and Gynecology

## 2017-05-06 ENCOUNTER — Ambulatory Visit (INDEPENDENT_AMBULATORY_CARE_PROVIDER_SITE_OTHER): Payer: BLUE CROSS/BLUE SHIELD | Admitting: Family Medicine

## 2017-05-06 ENCOUNTER — Ambulatory Visit
Admission: RE | Admit: 2017-05-06 | Discharge: 2017-05-06 | Disposition: A | Discharge status: Home / Self Care | Payer: BLUE CROSS/BLUE SHIELD | Source: Ambulatory Visit | Attending: Family Medicine | Admitting: Family Medicine

## 2017-05-06 ENCOUNTER — Encounter: Payer: Self-pay | Admitting: Family Medicine

## 2017-05-06 VITALS — BP 120/70 | HR 100 | Temp 98.4°F | Resp 16 | Ht 62.0 in | Wt 145.5 lb

## 2017-05-06 DIAGNOSIS — M25551 Pain in right hip: Secondary | ICD-10-CM

## 2017-05-06 DIAGNOSIS — M47896 Other spondylosis, lumbar region: Secondary | ICD-10-CM | POA: Insufficient documentation

## 2017-05-06 DIAGNOSIS — R4589 Other symptoms and signs involving emotional state: Secondary | ICD-10-CM | POA: Insufficient documentation

## 2017-05-06 DIAGNOSIS — F418 Other specified anxiety disorders: Secondary | ICD-10-CM

## 2017-05-06 MED ORDER — ESCITALOPRAM OXALATE 10 MG PO TABS: 5.0000 mg | ORAL_TABLET | Freq: Every day | ORAL | 0 refills | Status: DC

## 2017-05-06 NOTE — Patient Instructions (Addendum)
[facility] - [phone]; [email or web address] [facility].  "The daily meditation podcast"

## 2017-05-06 NOTE — Progress Notes (Addendum)
Name: Veronica Jenkins   MRN: [id number]    DOB: 1981/02/21   Date:05/06/2017       Progress Note  Subjective  Chief Complaint  Chief Complaint  Patient presents with  . Anxiety    HPI  Patient presents with complaint of significant anxiety over the last week.  She feels nauseated, no appetite, unable to function, very tearful, not sleeping well.  Pt is tearful in office and says it's all she can think about.  - She states that she has convinced herself she has cancer because she has been having RIGHT hip/RLQ pain. She was going to call to schedule appointment with Ms. Shambley CNM today for follow up on RLQ pain with recent abnormal menses, but wanted to address her anxiety first. The pain continues intermittently - has been taking Mobic PRN, and this has helped a little bit; pain is mostly focused on the RIGHT hip and not as much in the abdomen. We will obtain Xrays today.  - Has had anxiety episodes periodically throughout her life, usually related to her health.  Does have Rx for valium from a prior dental visit - took  3 days ago and this helped - has about 10 left; is willing to stop this medication and try antidepressant medication instead.  Patient Active Problem List   Diagnosis Date Noted  . Adolescent idiopathic scoliosis of lumbar region 09/08/2015  . Overweight (BMI 25.0-29.9) 06/08/2015    Social History  Substance Use Topics  . Smoking status: Former Smoker    Packs/day: 0.50    Years: 10.00    Types: Cigarettes    Quit date: 06/07/2008  . Smokeless tobacco: Never Used  . Alcohol use No     Comment: rare     Current Outpatient Prescriptions:  .  flintstones complete (FLINTSTONES) 60 MG chewable tablet, Chew 1 tablet by mouth daily., Disp: , Rfl:  .  meloxicam (MOBIC) 15 MG tablet, Take 0.5-1 tablets (7.5-15 mg total) by mouth daily., Disp: 30 tablet, Rfl: 0  Allergies  Allergen Reactions  . Morphine Nausea Only and Nausea And Vomiting    Patient states she  gets really sick    ROS  Ten systems reviewed and is negative except as mentioned in HPI  Objective  Vitals:   05/06/17 0903  BP: 120/70  Pulse: 100  Resp: 16  Temp: 98.4 F (36.9 C)  TempSrc: Oral  SpO2: 98%  Weight: 145 lb 8 oz (66 kg)  Height:  (1.575 m)   Body mass index is 26.61 kg/m.  Nursing Note and Vital Signs reviewed.  Physical Exam  Constitutional: Patient appears well-developed and well-nourished. No distress.  HEENT: head atraumatic, normocephalic Cardiovascular: Normal rate, regular rhythm, S1/S2 present.  No murmur or rub heard. No BLE edema. Pulmonary/Chest: Effort normal and breath sounds clear. No respiratory distress or retractions. Abdominal: Soft and non-tender, bowel sounds present x4 quadrants.  No CVA Tenderness Psychiatric: Patient has a very anxious mood and affect. behavior is appropriate for mood - tearful,anxious. Judgment and thought content normal.  Denies SI/HI Musculoskeletal: Normal range of motion, no joint effusions. No gross deformities. No spinal or hip bony tenderness; spine has full AROM and right hip has full AROM without pain.  Neurological: she is alert and oriented to person, place, and time. No cranial nerve deficit. Coordination, balance, strength, speech and gait are normal.  Skin: Skin is warm and dry. No rash noted. No erythema. .  Recent Results (from the past 2160  hour(s))  GC/Chlamydia Probe Amp     Status: None   Collection Time: 04/26/17  1:57 PM  Result Value Ref Range   CT Probe RNA NOT DETECTED     Comment:                    **Normal Reference Range: NOT DETECTED**   This test was performed using the APTIMA COMBO2 Assay ([employer]   The analytical performance characteristics of this assay, when used to test SurePath specimens have been determined by [facility]      GC Probe RNA NOT DETECTED     Comment:                    **Normal Reference Range: NOT DETECTED**   This test was  performed using the APTIMA COMBO2 Assay ([employer]   The analytical performance characteristics of this assay, when used to test SurePath specimens have been determined by [facility]     POCT Wet Prep Mellody Drown([place])     Status: Abnormal   Collection Time: 04/26/17  2:24 PM  Result Value Ref Range   Source Wet Prep POC Vaginal    WBC, Wet Prep HPF POC Few    Bacteria Wet Prep HPF POC Few Few   BACTERIA WET PREP MORPHOLOGY POC     Clue Cells Wet Prep HPF POC Moderate (A) None   Clue Cells Wet Prep Whiff POC     Yeast Wet Prep HPF POC Few    KOH Wet Prep POC     Trichomonas Wet Prep HPF POC  Absent  POCT urinalysis dipstick     Status: Abnormal   Collection Time: 04/26/17  3:21 PM  Result Value Ref Range   Color, UA     Clarity, UA     Glucose, UA neg    Bilirubin, UA neg    Ketones, UA neg    Spec Grav, UA 1.015 1.010 - 1.025   Blood, UA neg    pH, UA 5.0 5.0 - 8.0   Protein, UA neg    Urobilinogen, UA negative (A) 0.2 or 1.0 E.U./dL   Nitrite, UA neg    Leukocytes, UA Negative Negative   GAD 7 : Generalized Anxiety Score 05/06/2017  Nervous, Anxious, on Edge 1  Control/stop worrying 1  Worry too much - different things 3  Trouble relaxing 3  Restless 1  Easily annoyed or irritable 1  Afraid - awful might happen 3  Total GAD 7 Score 13  Anxiety Difficulty Somewhat difficult      Assessment & Plan  1. Anxiety about health - escitalopram (LEXAPRO) 10 MG tablet; Take 0.5-1 tablets (5-10 mg total) by mouth daily. Take 0.5 tablet daily in the morning for 7 days, then take 1tablet daily in the morning  Dispense: 30 tablet; Refill: 0 - Advised that she may take 1/2 tablet of Valium (2.5mg ) for acute panic attack only - advised patient that we will not prescribe additional refills of this medication. - Discussed SE's of Lexapro including yawning and possibility of increased anxiety in the first 1-2 weeks. She is in agreement to start this medication.  Also  discussed seeking counseling, meditation, and [facility] walk-in clinic if she feels she needs to be seen and we are not available.   2. Right hip pain - DG HIP UNILAT W OR W/O PELVIS 2-3 VIEWS RIGHT; Future - DG Lumbar Spine Complete; Future  -Red flags and when to present  for emergency care or RTC including fever >101.49F, chest pain, shortness of breath, SI/HI, new/worsening/un-resolving symptoms, reviewed with patient at time of visit. Follow up and care instructions discussed and provided in AVS.  I have reviewed this encounter including the documentation in this note and/or discussed this patient with the Deboraha Sprang, FNP, NP-C. I am certifying that I agree with the content of this note as supervising physician.  Alba Cory, MD [facility] 05/11/2017, 8:11 PM

## 2017-05-08 ENCOUNTER — Other Ambulatory Visit: Payer: Self-pay | Admitting: Obstetrics and Gynecology

## 2017-05-08 DIAGNOSIS — R1031 Right lower quadrant pain: Secondary | ICD-10-CM

## 2017-05-09 ENCOUNTER — Ambulatory Visit (INDEPENDENT_AMBULATORY_CARE_PROVIDER_SITE_OTHER): Payer: BLUE CROSS/BLUE SHIELD

## 2017-05-09 DIAGNOSIS — R1031 Right lower quadrant pain: Secondary | ICD-10-CM

## 2017-05-20 ENCOUNTER — Ambulatory Visit (INDEPENDENT_AMBULATORY_CARE_PROVIDER_SITE_OTHER): Payer: BLUE CROSS/BLUE SHIELD | Admitting: Family Medicine

## 2017-05-20 ENCOUNTER — Encounter: Payer: Self-pay | Admitting: Family Medicine

## 2017-05-20 VITALS — BP 100/60 | HR 76 | Temp 98.3°F | Resp 16 | Ht 65.0 in | Wt 145.5 lb

## 2017-05-20 DIAGNOSIS — M25551 Pain in right hip: Secondary | ICD-10-CM | POA: Diagnosis not present

## 2017-05-20 DIAGNOSIS — F418 Other specified anxiety disorders: Secondary | ICD-10-CM

## 2017-05-20 NOTE — Progress Notes (Addendum)
Name: Veronica Jenkins   MRN: [id number]    DOB: 1980-11-02   Date:05/20/2017       Progress Note  Subjective  Chief Complaint  Chief Complaint  Patient presents with  . Anxiety    2 week follow up    HPI  Patient presents for anxiety and hip pain follow up.  Anxiety: Started meditation, journaling, acupuncture - says these have all helped her to gain some control of her anxiety.  She did not start Lexapro because she wanted to try some alternative therapies first. Anxiety episodes are still difficult to control, but are much fewer and less in intensity - mostly in the mornings and around the start of her menses.  Acupuncturist recommended she try passion flower and CBD oil - she has tried the passion flower and is not sure if it worked or not. Has taken 1 - 1/2tab Valium since our last visit - she is again advised that we will not be able to fill this medication after what she does have runs out and she verbalizes understanding.  Advised that Lexapro is a safe medication if her alternative therapies are ineffective or if she needs additional support, and she agrees that if these therapies do not work she will try Lexapro.  RIGHT HIP Pain: Has been doing stretching and strengthening and is feeling much better.  Discomfort in low back and right hip mostly in the mornings. Had negative Xrays and Negative US Pelvis transvaginal.  She has seen a chiropractor and PT about 2 years ago and does not want to go back at this time. Discussed need for consistency in doing her exercises as least 3 days a week. She has not needed to take Meloxicam since her last visit, but she has it in case she needs to take it.  Patient Active Problem List   Diagnosis Date Noted  . Anxiety about health 05/06/2017  . Right hip pain 05/06/2017  . Adolescent idiopathic scoliosis of lumbar region 09/08/2015  . Overweight (BMI 25.0-29.9) 06/08/2015    Past Surgical History:  Procedure Laterality Date  . CESAREAN SECTION       Family History  Problem Relation Age of Onset  . Congestive Heart Failure Maternal Grandfather   . Heart disease Maternal Grandfather   . Diabetes Neg Hx   . Cancer Neg Hx   . Hypertension Neg Hx   . Stroke Neg Hx   . COPD Neg Hx     Social History   Social History  . Marital status: Married    Spouse name: N/A  . Number of children: N/A  . Years of education: N/A   Occupational History  . Not on file.   Social History Main Topics  . Smoking status: Former Smoker    Packs/day: 0.50    Years: 10.00    Types: Cigarettes    Quit date: 06/07/2008  . Smokeless tobacco: Never Used  . Alcohol use No     Comment: rare  . Drug use: No  . Sexual activity: Yes     Comment: husband-vasectomy   Other Topics Concern  . Not on file   Social History Narrative  . No narrative on file     Current Outpatient Prescriptions:  .  escitalopram (LEXAPRO) 10 MG tablet, Take 0.5-1 tablets (5-10 mg total) by mouth daily. Take 0.5 tablet daily in the morning for 7 days, then take 1tablet daily in the morning, Disp: 30 tablet, Rfl: 0 .  flintstones complete (FLINTSTONES)  60 MG chewable tablet, Chew 1 tablet by mouth daily., Disp: , Rfl:  .  meloxicam (MOBIC) 15 MG tablet, Take 0.5-1 tablets (7.5-15 mg total) by mouth daily., Disp: 30 tablet, Rfl: 0  Allergies  Allergen Reactions  . Morphine Nausea Only and Nausea And Vomiting    Patient states she gets really sick     ROS  Constitutional: Negative for fever or weight change.  Respiratory: Negative for cough and shortness of breath.   Cardiovascular: Negative for chest pain or palpitations.  Gastrointestinal: Negative for abdominal pain, no bowel changes.  Musculoskeletal: Negative for gait problem or joint swelling.  Skin: Negative for rash.  Neurological: Negative for dizziness or headache.  No other specific complaints in a complete review of systems (except as listed in HPI above).  Objective  Vitals:   05/20/17 0801   BP: 100/60  Pulse: 76  Resp: 16  Temp: 98.3 F (36.8 C)  TempSrc: Oral  SpO2: 98%  Weight: 145 lb 8 oz (66 kg)  Height:  (1.651 m)   Body mass index is 24.21 kg/m.  Physical Exam Constitutional: Patient appears well-developed and well-nourished. No distress.  HENT: Head: Normocephalic and atraumatic. Mouth/Throat: Oropharynx is clear and moist. No oropharyngeal exudate.  Eyes: Conjunctivae and EOM are normal. Pupils are equal, round, and reactive to light. No scleral icterus.  Neck: Normal range of motion. Neck supple. No JVD present. No thyromegaly present.  Cardiovascular: Normal rate, regular rhythm and normal heart sounds.  No murmur heard. No BLE edema. Pulmonary/Chest: Effort normal and breath sounds normal. No respiratory distress. Musculoskeletal: Normal range of motion, no joint effusions. No gross deformities. No bony tenderness to bilateral hips or spine. Full AROM of bilateral hip joints and spine.  Neurological: she is alert and oriented to person, place, and time. No cranial nerve deficit. Coordination, balance, strength, speech and gait are normal.  Skin: Skin is warm and dry. No rash noted. No erythema.  Psychiatric: Patient has a normal mood and affect. behavior is normal. Judgment and thought content normal.  No tearful episodes during visit today.  Recent Results (from the past 2160 hour(s))  GC/Chlamydia Probe Amp     Status: None   Collection Time: 04/26/17  1:57 PM  Result Value Ref Range   CT Probe RNA NOT DETECTED     Comment:                    **Normal Reference Range: NOT DETECTED**   This test was performed using the APTIMA COMBO2 Assay ([employer]   The analytical performance characteristics of this assay, when used to test SurePath specimens have been determined by [facility]      GC Probe RNA NOT DETECTED     Comment:                    **Normal Reference Range: NOT DETECTED**   This test was performed using the APTIMA  COMBO2 Assay ([employer]   The analytical performance characteristics of this assay, when used to test SurePath specimens have been determined by [facility]     POCT Wet Prep [facility])     Status: Abnormal   Collection Time: 04/26/17  2:24 PM  Result Value Ref Range   Source Wet Prep POC Vaginal    WBC, Wet Prep HPF POC Few    Bacteria Wet Prep HPF POC Few Few   BACTERIA WET PREP MORPHOLOGY POC  Clue Cells Wet Prep HPF POC Moderate (A) None   Clue Cells Wet Prep Whiff POC     Yeast Wet Prep HPF POC Few    KOH Wet Prep POC     Trichomonas Wet Prep HPF POC  Absent  POCT urinalysis dipstick     Status: Abnormal   Collection Time: 04/26/17  3:21 PM  Result Value Ref Range   Color, UA     Clarity, UA     Glucose, UA neg    Bilirubin, UA neg    Ketones, UA neg    Spec Grav, UA 1.015 1.010 - 1.025   Blood, UA neg    pH, UA 5.0 5.0 - 8.0   Protein, UA neg    Urobilinogen, UA negative (A) 0.2 or 1.0 E.U./dL   Nitrite, UA neg    Leukocytes, UA Negative Negative   PHQ2/9: Depression screen [facility] 2/9 04/26/2017 01/10/2017 09/03/2016 06/13/2016 06/08/2015  Decreased Interest 0 0 0 0 0  Down, Depressed, Hopeless 0 0 0 0 0  PHQ - 2 Score 0 0 0 0 0   Assessment & Plan  1. Anxiety about health Doing better today. Continue alternative therapies including meditation and journalling.  Will consider Lexapro if alternative therapies are ineffective and/or if she needs additional help in treating her symptoms.  Follow up with Dr. Sherie Don in 1 month.  2. Right hip pain Continue exercises - goal is to perform at least 3 times a week. We will consider referral to ortho and/or PT at her follow up with Dr. Sherie Don if not improving.  -Reviewed Health Maintenance: Schedule CPE with Dr. Sherie Don in 1 month.   I have reviewed this encounter including the documentation in this note and/or discussed this patient with the Deboraha Sprang, FNP, NP-C. I am certifying that I agree with the  content of this note as supervising physician.  Alba Cory, MD [facility] 05/20/2017, 5:15 PM

## 2017-05-22 ENCOUNTER — Other Ambulatory Visit: Payer: Self-pay | Admitting: Internal Medicine

## 2017-06-19 ENCOUNTER — Ambulatory Visit (INDEPENDENT_AMBULATORY_CARE_PROVIDER_SITE_OTHER): Payer: BLUE CROSS/BLUE SHIELD | Admitting: Family Medicine

## 2017-06-19 ENCOUNTER — Encounter: Payer: Self-pay | Admitting: Family Medicine

## 2017-06-19 VITALS — BP 126/84 | HR 99 | Temp 98.1°F | Resp 14 | Ht 61.5 in | Wt 145.2 lb

## 2017-06-19 DIAGNOSIS — Z0000 Encounter for general adult medical examination without abnormal findings: Secondary | ICD-10-CM | POA: Insufficient documentation

## 2017-06-19 DIAGNOSIS — Z23 Encounter for immunization: Secondary | ICD-10-CM

## 2017-06-19 LAB — COMPLETE METABOLIC PANEL WITH GFR
AG RATIO: 1.9 (calc) (ref 1.0–2.5)
ALBUMIN MSPROF: 4.5 g/dL (ref 3.6–5.1)
ALKALINE PHOSPHATASE (APISO): 56 U/L (ref 33–115)
ALT: 11 U/L (ref 6–29)
AST: 14 U/L (ref 10–30)
BILIRUBIN TOTAL: 0.3 mg/dL (ref 0.2–1.2)
BUN: 9 mg/dL (ref 7–25)
CO2: 26 mmol/L (ref 20–32)
Calcium: 9.1 mg/dL (ref 8.6–10.2)
Chloride: 105 mmol/L (ref 98–110)
Creat: 0.73 mg/dL (ref 0.50–1.10)
GFR, EST AFRICAN AMERICAN: 122 mL/min/{1.73_m2} (ref >=60)
GFR, EST NON AFRICAN AMERICAN: 105 mL/min/{1.73_m2} (ref >=60)
GLUCOSE: 81 mg/dL (ref 65–139)
Globulin: 2.4 g/dL (calc) (ref 1.9–3.7)
Potassium: 4 mmol/L (ref 3.5–5.3)
SODIUM: 138 mmol/L (ref 135–146)
Total Protein: 6.9 g/dL (ref 6.1–8.1)

## 2017-06-19 LAB — LIPID PANEL
CHOL/HDL RATIO: 2.4 (calc) (ref <5.0)
Cholesterol: 185 mg/dL (ref <200)
HDL: 76 mg/dL (ref >50)
LDL CHOLESTEROL (CALC): 95 mg/dL
NON-HDL CHOLESTEROL (CALC): 109 mg/dL (ref <130)
Triglycerides: 47 mg/dL (ref <150)

## 2017-06-19 LAB — CBC WITH DIFFERENTIAL/PLATELET
BASOS ABS: 28 {cells}/uL (ref 0–200)
Basophils Relative: 0.5 %
EOS ABS: 99 {cells}/uL (ref 15–500)
Eosinophils Relative: 1.8 %
HCT: 39.4 % (ref 35.0–45.0)
HEMOGLOBIN: 13.2 g/dL (ref 11.7–15.5)
Lymphs Abs: 2167 cells/uL (ref 850–3900)
MCH: 28 pg (ref 27.0–33.0)
MCHC: 33.5 g/dL (ref 32.0–36.0)
MCV: 83.5 fL (ref 80.0–100.0)
MONOS PCT: 9.2 %
MPV: 10.2 fL (ref 7.5–12.5)
Neutro Abs: 2701 cells/uL (ref 1500–7800)
Neutrophils Relative %: 49.1 %
Platelets: 222 10*3/uL (ref 140–400)
RBC: 4.72 10*6/uL (ref 3.80–5.10)
RDW: 12.8 % (ref 11.0–15.0)
TOTAL LYMPHOCYTE: 39.4 %
WBC mixed population: 506 cells/uL (ref 200–950)
WBC: 5.5 10*3/uL (ref 3.8–10.8)

## 2017-06-19 LAB — TSH: TSH: 1.69 m[IU]/L

## 2017-06-19 NOTE — Progress Notes (Signed)
Patient ID: Veronica Jenkins, female   DOB: [birth year], 36 y.o.   MRN: [id number]   Subjective:   Veronica Jenkins is a 36 y.o. female here for a complete physical exam  Interim issues since last visit: saw emily earlier; going to acupuncturist  USPSTF grade A and B recommendations Depression:  Depression screen [facility] 2/9 06/19/2017 04/26/2017 01/10/2017 09/03/2016 06/13/2016  Decreased Interest 0 0 0 0 0  Down, Depressed, Hopeless 0 0 0 0 0  PHQ - 2 Score 0 0 0 0 0   Hypertension: BP Readings from Last 3 Encounters:  06/19/17 126/84  05/20/17 100/60  05/06/17 120/70   Obesity: Wt Readings from Last 3 Encounters:  06/19/17 145 lb 3.2 oz (65.9 kg)  05/20/17 145 lb 8 oz (66 kg)  05/06/17 145 lb 8 oz (66 kg)   BMI Readings from Last 3 Encounters:  06/19/17 26.99 kg/m  05/20/17 24.21 kg/m  05/06/17 26.61 kg/m    Alcohol: occasional Tobacco use: quit in her 30s HIV, hep B, hep C: not interested STD testing and prevention (chl/gon/syphilis): not interested Intimate partner violence: no abuse Breast cancer: through GYN BRCA gene screening: no breast or ovarian cancer Cervical cancer screening: through GYN Osteoporosis: n/a Fall prevention/vitamin D: spends time outdoors Lipids:  Lab Results  Component Value Date   CHOL 187 06/13/2016   CHOL 207 (H) 12/27/2015   CHOL 174 06/08/2015   Lab Results  Component Value Date   HDL 66 06/13/2016   HDL 69 12/27/2015   HDL 62 06/08/2015   Lab Results  Component Value Date   LDLCALC 111 06/13/2016   LDLCALC 126 (H) 12/27/2015   LDLCALC 101 (H) 06/08/2015   Lab Results  Component Value Date   TRIG 49 06/13/2016   TRIG 61 12/27/2015   TRIG 53 06/08/2015   Lab Results  Component Value Date   CHOLHDL 2.8 06/13/2016   CHOLHDL 3.0 12/27/2015   No results found for: LDLDIRECT Glucose:  Glucose  Date Value Ref Range Status  12/27/2015 79 65 - 99 mg/dL Final  06/08/2015 81 65 - 99 mg/dL Final  04/17/2014 98 65 - 99 mg/dL  Final   Glucose, Bld  Date Value Ref Range Status  01/10/2017 89 65 - 99 mg/dL Final  06/13/2016 85 65 - 99 mg/dL Final  01/07/2010 83 70 - 99 mg/dL Final   Colorectal cancer: no fam hx Lung cancer:  Remote smoker Diet: used to be horrible; she thinks she has improved; she does not buy chocolate to keep in the house; has cut things out; just doesn't buy it to keep it in the house; not as many potato chips; brushes teeth at 8 pm to avoid eating after that; encouraged more veggies Exercise: tries to walk 30 minutes a few times a week Skin cancer: no worrisome moles  History reviewed. No pertinent past medical history.   Past Surgical History:  Procedure Laterality Date  . CESAREAN SECTION     Family History  Problem Relation Age of Onset  . Congestive Heart Failure Maternal Grandfather   . Heart disease Maternal Grandfather   . Diabetes Neg Hx   . Cancer Neg Hx   . Hypertension Neg Hx   . Stroke Neg Hx   . COPD Neg Hx    Social History  Substance Use Topics  . Smoking status: Former Smoker    Packs/day: 0.50    Years: 10.00    Types: Cigarettes    Quit date: 06/07/2008  .  Smokeless tobacco: Never Used  . Alcohol use Yes     Comment: rare   Review of Systems  Constitutional: Negative for unexpected weight change.  Gastrointestinal: Negative for blood in stool.  Genitourinary: Negative for hematuria.  Hematological: Positive for adenopathy (seeing ENT, just there, getting yearly CBC with diff).    Objective:   Vitals:   06/19/17 0827  BP: 126/84  Pulse: 99  Resp: 14  Temp: 98.1 F (36.7 C)  TempSrc: Oral  SpO2: 96%  Weight: 145 lb 3.2 oz (65.9 kg)  Height: 5' 1.5" (1.562 m)   Body mass index is 26.99 kg/m. Wt Readings from Last 3 Encounters:  06/19/17 145 lb 3.2 oz (65.9 kg)  05/20/17 145 lb 8 oz (66 kg)  05/06/17 145 lb 8 oz (66 kg)   Physical Exam  Constitutional: She appears well-developed and well-nourished.  HENT:  Head: Normocephalic and  atraumatic.  Right Ear: Hearing, tympanic membrane, external ear and ear canal normal.  Left Ear: Hearing, tympanic membrane, external ear and ear canal normal.  Eyes: Conjunctivae and EOM are normal. Right eye exhibits no hordeolum. Left eye exhibits no hordeolum. No scleral icterus.  Neck: Carotid bruit is not present. No thyromegaly present.  Cardiovascular: Normal rate, regular rhythm, S1 normal, S2 normal and normal heart sounds.   No extrasystoles are present.  Pulmonary/Chest: Effort normal and breath sounds normal. No respiratory distress.  Abdominal: Soft. Normal appearance and bowel sounds are normal. She exhibits no distension, no abdominal bruit, no pulsatile midline mass and no mass. There is no hepatosplenomegaly. There is no tenderness. No hernia.  Musculoskeletal: Normal range of motion. She exhibits no edema.  Lymphadenopathy:       Head (right side): No submandibular adenopathy present.       Head (left side): No submandibular adenopathy present.    She has no cervical adenopathy.    She has no axillary adenopathy.  Neurological: She is alert. She displays no tremor. No cranial nerve deficit. She exhibits normal muscle tone. Gait normal.  Reflex Scores:      Patellar reflexes are 2+ on the right side and 2+ on the left side. Skin: Skin is warm and dry. No bruising and no ecchymosis noted. No cyanosis. No pallor.  Psychiatric: Her speech is normal and behavior is normal. Thought content normal. Her mood appears not anxious. She does not exhibit a depressed mood.   Assessment/Plan:   Problem List Items Addressed This Visit      Other   Preventative health care - Primary    USPSTF grade A and B recommendations reviewed with patient; age-appropriate recommendations, preventive care, screening tests, etc discussed and encouraged; healthy living encouraged; see AVS for patient education given to patient       Relevant Orders   CBC with Differential/Platelet   COMPLETE  METABOLIC PANEL WITH GFR   Lipid panel   TSH    Other Visit Diagnoses    Needs flu shot       Relevant Orders   Flu Vaccine QUAD 6+ mos PF IM (Fluarix Quad PF) (Completed)       No orders of the defined types were placed in this encounter.  Orders Placed This Encounter  Procedures  . Flu Vaccine QUAD 6+ mos PF IM (Fluarix Quad PF)  . CBC with Differential/Platelet  . COMPLETE METABOLIC PANEL WITH GFR  . Lipid panel  . TSH    Follow up plan: Return in about 1 year (around 06/19/2018) for complete  physical.  An After Visit Summary was printed and given to the patient.

## 2017-06-19 NOTE — Assessment & Plan Note (Signed)
USPSTF grade A and B recommendations reviewed with patient; age-appropriate recommendations, preventive care, screening tests, etc discussed and encouraged; healthy living encouraged; see AVS for patient education given to patient  

## 2017-06-19 NOTE — Patient Instructions (Addendum)
We'll get labs today Health Maintenance, Female Adopting a healthy lifestyle and getting preventive care can go a long way to promote health and wellness. Talk with your health care provider about what schedule of regular examinations is right for you. This is a good chance for you to check in with your provider about disease prevention and staying healthy. In between checkups, there are plenty of things you can do on your own. Experts have done a lot of research about which lifestyle changes and preventive measures are most likely to keep you healthy. Ask your health care provider for more information. Weight and diet Eat a healthy diet  Be sure to include plenty of vegetables, fruits, low-fat dairy products, and lean protein.  Do not eat a lot of foods high in solid fats, added sugars, or salt.  Get regular exercise. This is one of the most important things you can do for your health. ? Most adults should exercise for at least 150 minutes each week. The exercise should increase your heart rate and make you sweat (moderate-intensity exercise). ? Most adults should also do strengthening exercises at least twice a week. This is in addition to the moderate-intensity exercise.  Maintain a healthy weight  Body mass index (BMI) is a measurement that can be used to identify possible weight problems. It estimates body fat based on height and weight. Your health care provider can help determine your BMI and help you achieve or maintain a healthy weight.  For females 37 years of age and older: ? A BMI below 18.5 is considered underweight. ? A BMI of 18.5 to 24.9 is normal. ? A BMI of 25 to 29.9 is considered overweight. ? A BMI of 30 and above is considered obese.  Watch levels of cholesterol and blood lipids  You should start having your blood tested for lipids and cholesterol at 37 years of age, then have this test every 5 years.  You may need to have your cholesterol levels checked more often  if: ? Your lipid or cholesterol levels are high. ? You are older than 38 years of age. ? You are at high risk for heart disease.  Cancer screening Lung Cancer  Lung cancer screening is recommended for adults 37-3 years old who are at high risk for lung cancer because of a history of smoking.  A yearly low-dose CT scan of the lungs is recommended for people who: ? Currently smoke. ? Have quit within the past 15 years. ? Have at least a 30-pack-year history of smoking. A pack year is smoking an average of one pack of cigarettes a day for 1 year.  Yearly screening should continue until it has been 15 years since you quit.  Yearly screening should stop if you develop a health problem that would prevent you from having lung cancer treatment.  Breast Cancer  Practice breast self-awareness. This means understanding how your breasts normally appear and feel.  It also means doing regular breast self-exams. Let your health care provider know about any changes, no matter how small.  If you are in your 20s or 30s, you should have a clinical breast exam (CBE) by a health care provider every 1-3 years as part of a regular health exam.  If you are 15 or older, have a CBE every year. Also consider having a breast X-ray (mammogram) every year.  If you have a family history of breast cancer, talk to your health care provider about genetic screening.  If you  are at high risk for breast cancer, talk to your health care provider about having an MRI and a mammogram every year.  Breast cancer gene (BRCA) assessment is recommended for women who have family members with BRCA-related cancers. BRCA-related cancers include: ? Breast. ? Ovarian. ? Tubal. ? Peritoneal cancers.  Results of the assessment will determine the need for genetic counseling and BRCA1 and BRCA2 testing.  Cervical Cancer Your health care provider may recommend that you be screened regularly for cancer of the pelvic organs  (ovaries, uterus, and vagina). This screening involves a pelvic examination, including checking for microscopic changes to the surface of your cervix (Pap test). You may be encouraged to have this screening done every 3 years, beginning at age 72.  For women ages 67-65, health care providers may recommend pelvic exams and Pap testing every 3 years, or they may recommend the Pap and pelvic exam, combined with testing for human papilloma virus (HPV), every 5 years. Some types of HPV increase your risk of cervical cancer. Testing for HPV may also be done on women of any age with unclear Pap test results.  Other health care providers may not recommend any screening for nonpregnant women who are considered low risk for pelvic cancer and who do not have symptoms. Ask your health care provider if a screening pelvic exam is right for you.  If you have had past treatment for cervical cancer or a condition that could lead to cancer, you need Pap tests and screening for cancer for at least 20 years after your treatment. If Pap tests have been discontinued, your risk factors (such as having a new sexual partner) need to be reassessed to determine if screening should resume. Some women have medical problems that increase the chance of getting cervical cancer. In these cases, your health care provider may recommend more frequent screening and Pap tests.  Colorectal Cancer  This type of cancer can be detected and often prevented.  Routine colorectal cancer screening usually begins at 37 years of age and continues through 37 years of age.  Your health care provider may recommend screening at an earlier age if you have risk factors for colon cancer.  Your health care provider may also recommend using home test kits to check for hidden blood in the stool.  A small camera at the end of a tube can be used to examine your colon directly (sigmoidoscopy or colonoscopy). This is done to check for the earliest forms of  colorectal cancer.  Routine screening usually begins at age 37.  Direct examination of the colon should be repeated every 5-10 years through 37 years of age. However, you may need to be screened more often if early forms of precancerous polyps or small growths are found.  Skin Cancer  Check your skin from head to toe regularly.  Tell your health care provider about any new moles or changes in moles, especially if there is a change in a mole's shape or color.  Also tell your health care provider if you have a mole that is larger than the size of a pencil eraser.  Always use sunscreen. Apply sunscreen liberally and repeatedly throughout the day.  Protect yourself by wearing long sleeves, pants, a wide-brimmed hat, and sunglasses whenever you are outside.  Heart disease, diabetes, and high blood pressure  High blood pressure causes heart disease and increases the risk of stroke. High blood pressure is more likely to develop in: ? People who have blood pressure in  the high end of the normal range (130-139/85-89 mm Hg). ? People who are overweight or obese. ? People who are African American.  If you are 18-39 years of age, have your blood pressure checked every 3-5 years. If you are 40 years of age or older, have your blood pressure checked every year. You should have your blood pressure measured twice-once when you are at a hospital or clinic, and once when you are not at a hospital or clinic. Record the average of the two measurements. To check your blood pressure when you are not at a hospital or clinic, you can use: ? An automated blood pressure machine at a pharmacy. ? A home blood pressure monitor.  If you are between 55 years and 79 years old, ask your health care provider if you should take aspirin to prevent strokes.  Have regular diabetes screenings. This involves taking a blood sample to check your fasting blood sugar level. ? If you are at a normal weight and have a low risk  for diabetes, have this test once every three years after 37 years of age. ? If you are overweight and have a high risk for diabetes, consider being tested at a younger age or more often. Preventing infection Hepatitis B  If you have a higher risk for hepatitis B, you should be screened for this virus. You are considered at high risk for hepatitis B if: ? You were born in a country where hepatitis B is common. Ask your health care provider which countries are considered high risk. ? Your parents were born in a high-risk country, and you have not been immunized against hepatitis B (hepatitis B vaccine). ? You have HIV or AIDS. ? You use needles to inject street drugs. ? You live with someone who has hepatitis B. ? You have had sex with someone who has hepatitis B. ? You get hemodialysis treatment. ? You take certain medicines for conditions, including cancer, organ transplantation, and autoimmune conditions.  Hepatitis C  Blood testing is recommended for: ? Everyone born from 1945 through 1965. ? Anyone with known risk factors for hepatitis C.  Sexually transmitted infections (STIs)  You should be screened for sexually transmitted infections (STIs) including gonorrhea and chlamydia if: ? You are sexually active and are younger than 37 years of age. ? You are older than 37 years of age and your health care provider tells you that you are at risk for this type of infection. ? Your sexual activity has changed since you were last screened and you are at an increased risk for chlamydia or gonorrhea. Ask your health care provider if you are at risk.  If you do not have HIV, but are at risk, it may be recommended that you take a prescription medicine daily to prevent HIV infection. This is called pre-exposure prophylaxis (PrEP). You are considered at risk if: ? You are sexually active and do not regularly use condoms or know the HIV status of your partner(s). ? You take drugs by  injection. ? You are sexually active with a partner who has HIV.  Talk with your health care provider about whether you are at high risk of being infected with HIV. If you choose to begin PrEP, you should first be tested for HIV. You should then be tested every 3 months for as long as you are taking PrEP. Pregnancy  If you are premenopausal and you may become pregnant, ask your health care provider about preconception counseling.    If you may become pregnant, take 400 to 800 micrograms (mcg) of folic acid every day.  If you want to prevent pregnancy, talk to your health care provider about birth control (contraception). Osteoporosis and menopause  Osteoporosis is a disease in which the bones lose minerals and strength with aging. This can result in serious bone fractures. Your risk for osteoporosis can be identified using a bone density scan.  If you are 17 years of age or older, or if you are at risk for osteoporosis and fractures, ask your health care provider if you should be screened.  Ask your health care provider whether you should take a calcium or vitamin D supplement to lower your risk for osteoporosis.  Menopause may have certain physical symptoms and risks.  Hormone replacement therapy may reduce some of these symptoms and risks. Talk to your health care provider about whether hormone replacement therapy is right for you. Follow these instructions at home:  Schedule regular health, dental, and eye exams.  Stay current with your immunizations.  Do not use any tobacco products including cigarettes, chewing tobacco, or electronic cigarettes.  If you are pregnant, do not drink alcohol.  If you are breastfeeding, limit how much and how often you drink alcohol.  Limit alcohol intake to no more than 1 drink per day for nonpregnant women. One drink equals 12 ounces of beer, 5 ounces of wine, or 1 ounces of hard liquor.  Do not use street drugs.  Do not share needles.  Ask  your health care provider for help if you need support or information about quitting drugs.  Tell your health care provider if you often feel depressed.  Tell your health care provider if you have ever been abused or do not feel safe at home. This information is not intended to replace advice given to you by your health care provider. Make sure you discuss any questions you have with your health care provider. Document Released: 02/26/2011 Document Revised: 01/19/2016 Document Reviewed: 05/17/2015 Elsevier Interactive Patient Education  [employer].

## 2017-07-12 ENCOUNTER — Telehealth: Payer: Self-pay | Admitting: Obstetrics and Gynecology

## 2017-07-12 NOTE — Telephone Encounter (Signed)
The patient called and stated that she is having problems and irritation in her clitoris area and the patient stated that the discomfort is unbearable. The patient would like a call back as soon as possible. No other information was disclosed. Please advise.

## 2017-07-17 NOTE — Telephone Encounter (Signed)
Left pt detailed message to contact office

## 2017-08-02 ENCOUNTER — Ambulatory Visit (INDEPENDENT_AMBULATORY_CARE_PROVIDER_SITE_OTHER): Payer: BLUE CROSS/BLUE SHIELD | Admitting: Obstetrics and Gynecology

## 2017-08-02 ENCOUNTER — Other Ambulatory Visit: Payer: Self-pay | Admitting: Obstetrics and Gynecology

## 2017-08-02 ENCOUNTER — Encounter: Payer: Self-pay | Admitting: Obstetrics and Gynecology

## 2017-08-02 VITALS — BP 148/84 | HR 88 | Wt 145.4 lb

## 2017-08-02 DIAGNOSIS — N9089 Other specified noninflammatory disorders of vulva and perineum: Secondary | ICD-10-CM

## 2017-08-02 MED ORDER — LIDOCAINE HCL 2 % EX GEL: 1.0000 "application " | CUTANEOUS | 2 refills | Status: DC | PRN

## 2017-08-02 NOTE — Progress Notes (Signed)
Subjective:     Patient ID: Veronica Jenkins, female   DOB: [birth year], 36 y.o.   MRN: [id number]  HPI 2 bumps on inner labia x 4 weeks. Left side got big and drained, but the right one hasn't changed. Some burning & itching. Uses hot compresses and it usually goes away.   Review of Systems     Objective:   Physical Exam A&Ox4 Well groomed female in no distress Blood pressure (!) 148/84, pulse 88, weight 145 lb 6.4 oz (66 kg), last menstrual period 07/24/2017. Pelvic exam: VULVA: vulvar tenderness & vulvar lesion with  hyperpigmentation on both inner labia, suspicious for HPV with secondary infection  Left labia raised red 5x626mm with HPV changes extending to introitus, right lesion 2x434mm and less raised., VAGINA: normal appearing vagina with normal color and discharge, no lesions, WET MOUNT done - results: negative for pathogens, normal epithelial cells.    Assessment:     Labia lesions    Plan:     Cultures and punch biopsy obtained- will follow up accordingly. RTC in 10 days for follow up.  Damarri Rampy,CNM

## 2017-08-06 ENCOUNTER — Other Ambulatory Visit: Payer: Self-pay | Admitting: Obstetrics and Gynecology

## 2017-08-06 MED ORDER — PODOFILOX 0.5 % EX GEL: CUTANEOUS | 2 refills | Status: DC

## 2017-08-07 ENCOUNTER — Encounter: Payer: Self-pay | Admitting: Obstetrics and Gynecology

## 2017-08-07 LAB — HERPES SIMPLEX VIRUS CULTURE

## 2017-09-04 DIAGNOSIS — N9089 Other specified noninflammatory disorders of vulva and perineum: Secondary | ICD-10-CM | POA: Diagnosis not present

## 2017-09-04 DIAGNOSIS — Z6827 Body mass index (BMI) 27.0-27.9, adult: Secondary | ICD-10-CM | POA: Diagnosis not present

## 2017-09-11 ENCOUNTER — Ambulatory Visit (INDEPENDENT_AMBULATORY_CARE_PROVIDER_SITE_OTHER): Payer: BLUE CROSS/BLUE SHIELD | Admitting: Family Medicine

## 2017-09-11 ENCOUNTER — Encounter: Payer: Self-pay | Admitting: Family Medicine

## 2017-09-11 VITALS — BP 116/84 | HR 100 | Temp 98.5°F | Resp 16 | Ht 62.0 in | Wt 147.3 lb

## 2017-09-11 DIAGNOSIS — S3991XA Unspecified injury of abdomen, initial encounter: Secondary | ICD-10-CM

## 2017-09-11 DIAGNOSIS — M25551 Pain in right hip: Secondary | ICD-10-CM

## 2017-09-11 DIAGNOSIS — K5900 Constipation, unspecified: Secondary | ICD-10-CM

## 2017-09-11 MED ORDER — MELOXICAM 15 MG PO TABS: 15.0000 mg | ORAL_TABLET | Freq: Every day | ORAL | 0 refills | Status: DC

## 2017-09-11 NOTE — Patient Instructions (Addendum)
Constipation, Adult Constipation is when a person:  Poops (has a bowel movement) fewer times in a week than normal.  Has a hard time pooping.  Has poop that is dry, hard, or bigger than normal.  Follow these instructions at home: Eating and drinking   Eat foods that have a lot of fiber, such as: ? Fresh fruits and vegetables. ? Whole grains. ? Beans.  Eat less of foods that are high in fat, low in fiber, or overly processed, such as: ? [place] fries. ? Hamburgers. ? Cookies. ? Candy. ? Soda.  Drink enough fluid to keep your pee (urine) clear or pale yellow. General instructions  Exercise regularly or as told by your doctor.  Go to the restroom when you feel like you need to poop. Do not hold it in.  Take over-the-counter and prescription medicines only as told by your doctor. These include any fiber supplements.  Do pelvic floor retraining exercises, such as: ? Doing deep breathing while relaxing your lower belly (abdomen). ? Relaxing your pelvic floor while pooping.  Watch your condition for any changes.  Keep all follow-up visits as told by your doctor. This is important. Contact a doctor if:  You have pain that gets worse.  You have a fever.  You have not pooped for 4 days.  You throw up (vomit).  You are not hungry.  You lose weight.  You are bleeding from the anus.  You have thin, pencil-like poop (stool). Get help right away if:  You have a fever, and your symptoms suddenly get worse.  You leak poop or have blood in your poop.  Your belly feels hard or bigger than normal (is bloated).  You have very bad belly pain.  You feel dizzy or you faint. This information is not intended to replace advice given to you by your health care provider. Make sure you discuss any questions you have with your health care provider. Document Released: 01/30/2008 Document Revised: 03/02/2016 Document Reviewed: 02/01/2016 Elsevier Interactive Patient Education   2018 [employer] Inc[employer]  Abdominal Pain, Adult Many things can cause belly (abdominal) pain. Most times, belly pain is not dangerous. Many cases of belly pain can be watched and treated at home. Sometimes belly pain is serious, though. Your doctor will try to find the cause of your belly pain. Follow these instructions at home:  Take over-the-counter and prescription medicines only as told by your doctor. Do not take medicines that help you poop (laxatives) unless told to by your doctor.  Drink enough fluid to keep your pee (urine) clear or pale yellow.  Watch your belly pain for any changes.  Keep all follow-up visits as told by your doctor. This is important. Contact a doctor if:  Your belly pain changes or gets worse.  You are not hungry, or you lose weight without trying.  You are having trouble pooping (constipated) or have watery poop (diarrhea) for more than 2-3 days.  You have pain when you pee or poop.  Your belly pain wakes you up at night.  Your pain gets worse with meals, after eating, or with certain foods.  You are throwing up and cannot keep anything down.  You have a fever. Get help right away if:  Your pain does not go away as soon as your doctor says it should.  You cannot stop throwing up.  Your pain is only in areas of your belly, such as the right side or the left lower part of the belly.  You have bloody or black poop, or poop that looks like tar.  You have very bad pain, cramping, or bloating in your belly.  You have signs of not having enough fluid or water in your body (dehydration), such as: ? Dark pee, very little pee, or no pee. ? Cracked lips. ? Dry mouth. ? Sunken eyes. ? Sleepiness. ? Weakness. This information is not intended to replace advice given to you by your health care provider. Make sure you discuss any questions you have with your health care provider. Document Released: 01/30/2008 Document Revised: 03/02/2016 Document Reviewed:  01/25/2016 Elsevier Interactive Patient Education  2018 [employer]

## 2017-09-11 NOTE — Progress Notes (Signed)
Name: Veronica Jenkins   MRN: [id number]    DOB: 05/16/1981   Date:09/11/2017       Progress Note  Subjective  Chief Complaint  Chief Complaint  Patient presents with  . Abdominal Pain    pain around belly button, for 3 days  . Constipation    HPI  4 days ago she was putting something in the trash can and felt a "pop" in her abdomen, has had pain just to the right of her umbilicus since then.  OVer a week ago she was constipated, this has slowly been improving Pain is sharp sometimes, mostly dull; no pain when lying down.  Also having RIGHT groin pain - she questions if this is related to her abdominal pain or to her ongoing RIGHT hip pain.    No nausea or vomiting, diarrhea, hx gall bladder issues.  Has had 2 C-sections, otherwise no abdominal surgeries.  Has not noticed any bulging, swelling, bruising to abdomen or groin area.  Patient Active Problem List   Diagnosis Date Noted  . Preventative health care 06/19/2017  . Anxiety about health 05/06/2017  . Right hip pain 05/06/2017  . Adolescent idiopathic scoliosis of lumbar region 09/08/2015  . Overweight (BMI 25.0-29.9) 06/08/2015    Social History   Tobacco Use  . Smoking status: Former Smoker    Packs/day: 0.50    Years: 10.00    Pack years: 5.00    Types: Cigarettes    Last attempt to quit: 06/07/2008    Years since quitting: 9.2  . Smokeless tobacco: Never Used  Substance Use Topics  . Alcohol use: Yes    Comment: rare     Current Outpatient Medications:  .  flintstones complete (FLINTSTONES) 60 MG chewable tablet, Chew 1 tablet by mouth daily., Disp: , Rfl:  .  lidocaine (XYLOCAINE) 2 % jelly, Apply 1 application topically as needed. (Patient not taking: Reported on 09/11/2017), Disp: 30 mL, Rfl: 2 .  podofilox (CONDYLOX) 0.5 % gel, Apply to affected area every other night, wash off in the morning (Patient not taking: Reported on 09/11/2017), Disp: 3.5 g, Rfl: 2  Allergies  Allergen Reactions  . Morphine Nausea  Only and Nausea And Vomiting    Patient states she gets really sick    ROS  Constitutional: Negative for fever or weight change.  Respiratory: Negative for cough and shortness of breath.   Cardiovascular: Negative for chest pain or palpitations.  Gastrointestinal: Negative for abdominal pain, no bowel changes. No heartburn, no blood in stools or urine, no dark and tarry stools. GU: No dysuria or hematuria. Musculoskeletal: Negative for gait problem or joint swelling.  Skin: Negative for rash.  Neurological: Negative for dizziness or headache.  No other specific complaints in a complete review of systems (except as listed in HPI above).  Objective  Vitals:   09/11/17 1118  BP: 116/84  Pulse: 100  Resp: 16  Temp: 98.5 F (36.9 C)  TempSrc: Oral  SpO2: 97%  Weight: 147 lb 4.8 oz (66.8 kg)  Height: 5\' 2"  (1.575 m)    Body mass index is 26.94 kg/m.  Nursing Note and Vital Signs reviewed.  Physical Exam  Abdominal:      Constitutional: Patient appears well-developed and well-nourished.  No distress.  HEENT: head atraumatic, normocephalic Cardiovascular: Normal rate, regular rhythm, S1/S2 present.  No murmur or rub heard. No BLE edema. Pulmonary/Chest: Effort normal and breath sounds clear. No respiratory distress or retractions. Abdominal: Soft and non-tender, bowel sounds  present x4 quadrants.  No CVA Tenderness. +Carnett's Sign elicited to the right of the umbilicus.  No groin tenderness. No appreciable hernias, masses, no HSM, no abdominal wall abnormalities. Psychiatric: Patient has a normal mood and affect. behavior is normal. Judgment and thought content normal. Musculoskeletal: Normal range of motion, no joint effusions. No gross deformities Neurological: she is alert and oriented to person, place, and time. No cranial nerve deficit. Coordination, balance, strength, speech and gait are normal.  Skin: Skin is warm and dry. No rash noted. No erythema.   No results  found for this or any previous visit (from the past 72 hour(s)).  Assessment & Plan  1. Injury of abdominal wall, initial encounter - meloxicam (MOBIC) 15 MG tablet; Take 1 tablet (15 mg total) by mouth daily.  Dispense: 30 tablet; Refill: 0 - Rest, heating pad as needed. Follow up in 1-2 weeks if not improving, sooner if worsening. - Advised likely abdominal muscle strain due to lack of concerning findings.  2. Constipation, unspecified constipation type - Will purchase OTC Metamucil or Miralax, stay well hydrated, walk regularly. - Avoid straining.  3. Right hip pain - Has appointment tomorrow morning with [facility] to discuss hip pain. Advised she discuss posterior hip and right groin pain with specialist at this visit.  -Red flags and when to present for emergency care or RTC including fever >101.1F, chest pain, shortness of breath, new/worsening/un-resolving symptoms, vomiting, hematuria, blood in stool/dark and tarry stools reviewed with patient at time of visit. Follow up and care instructions discussed and provided in AVS.  - Discussed  Patient case with PCP Dr. Sherie DonLada who is in agreement with plan of care.

## 2017-09-12 DIAGNOSIS — M25551 Pain in right hip: Secondary | ICD-10-CM | POA: Diagnosis not present

## 2017-09-12 DIAGNOSIS — M5116 Intervertebral disc disorders with radiculopathy, lumbar region: Secondary | ICD-10-CM | POA: Diagnosis not present

## 2017-09-12 DIAGNOSIS — M5136 Other intervertebral disc degeneration, lumbar region: Secondary | ICD-10-CM | POA: Diagnosis not present

## 2017-09-19 ENCOUNTER — Ambulatory Visit (INDEPENDENT_AMBULATORY_CARE_PROVIDER_SITE_OTHER): Payer: BLUE CROSS/BLUE SHIELD | Admitting: Family Medicine

## 2017-09-19 ENCOUNTER — Encounter: Payer: Self-pay | Admitting: Family Medicine

## 2017-09-19 ENCOUNTER — Telehealth: Payer: Self-pay

## 2017-09-19 VITALS — BP 118/72 | HR 99 | Temp 98.6°F | Resp 16 | Ht 62.0 in | Wt 145.5 lb

## 2017-09-19 DIAGNOSIS — R109 Unspecified abdominal pain: Secondary | ICD-10-CM | POA: Diagnosis not present

## 2017-09-19 LAB — CBC WITH DIFFERENTIAL/PLATELET
BASOS PCT: 0.5 %
Basophils Absolute: 30 cells/uL (ref 0–200)
EOS ABS: 130 {cells}/uL (ref 15–500)
Eosinophils Relative: 2.2 %
HCT: 41.3 % (ref 35.0–45.0)
Hemoglobin: 13.9 g/dL (ref 11.7–15.5)
Lymphs Abs: 2136 cells/uL (ref 850–3900)
MCH: 29.1 pg (ref 27.0–33.0)
MCHC: 33.7 g/dL (ref 32.0–36.0)
MCV: 86.4 fL (ref 80.0–100.0)
MONOS PCT: 9 %
MPV: 10.3 fL (ref 7.5–12.5)
Neutro Abs: 3074 cells/uL (ref 1500–7800)
Neutrophils Relative %: 52.1 %
Platelets: 226 10*3/uL (ref 140–400)
RBC: 4.78 10*6/uL (ref 3.80–5.10)
RDW: 12.4 % (ref 11.0–15.0)
TOTAL LYMPHOCYTE: 36.2 %
WBC mixed population: 531 cells/uL (ref 200–950)
WBC: 5.9 10*3/uL (ref 3.8–10.8)

## 2017-09-19 LAB — BASIC METABOLIC PANEL WITH GFR
BUN: 8 mg/dL (ref 7–25)
CHLORIDE: 105 mmol/L (ref 98–110)
CO2: 29 mmol/L (ref 20–32)
Calcium: 9.6 mg/dL (ref 8.6–10.2)
Creat: 0.78 mg/dL (ref 0.50–1.10)
GFR, Est African American: 113 mL/min/{1.73_m2} (ref >=60)
GFR, Est Non African American: 97 mL/min/{1.73_m2} (ref >=60)
GLUCOSE: 85 mg/dL (ref 65–99)
POTASSIUM: 4.2 mmol/L (ref 3.5–5.3)
SODIUM: 142 mmol/L (ref 135–146)

## 2017-09-19 LAB — LIPASE: LIPASE: 13 U/L (ref 7–60)

## 2017-09-19 NOTE — Telephone Encounter (Signed)
Patient has been informed that she has been scheduled to see Dr. Allegra LaiVanga on 09/20/17 @ 9:30am.

## 2017-09-19 NOTE — Progress Notes (Signed)
Name: Veronica Jenkins   MRN: [id number]    DOB: [birth year]   Date:09/19/2017       Progress Note  Subjective  Chief Complaint  Chief Complaint  Patient presents with  . Abdominal Pain    follow up, not much better    HPI  Pt presents with concern for RIGHT sided abdominal pain that has been ongoing for several weeks now.  Pain is intermittent and is present from the upper to lower quadrant and sometimes radiates into her right groin.  She is unable to lay on her right side because it causes too much discomfort.  She was seen 09/11/17 for the same, tried meloxicam, and she doesn't think this helped.  Constipation has improved, no nausea, no diarrhea; appetite is still intermittently decreased.  She saw ortho regarding her right hip pain, and is scheduled for a lower back MRI in 1.5 weeks.  She also saw Melody Shambley CNM with OBGYN in September 2018 for RIGHT hip/RLQ pain, had transvaginal US and results were WNL.  Denies incontinence, rectal bleeding/blood in stools/dark and tarry stools, abnormal vaginal bleeding, blood in urine, dysuria, urinary frequency/urgency.  Has not had intercourse recently to know if it is painful.  Patient Active Problem List   Diagnosis Date Noted  . Preventative health care 06/19/2017  . Anxiety about health 05/06/2017  . Right hip pain 05/06/2017  . Adolescent idiopathic scoliosis of lumbar region 09/08/2015  . Overweight (BMI 25.0-29.9) 06/08/2015    Social History   Tobacco Use  . Smoking status: Former Smoker    Packs/day: 0.50    Years: 10.00    Pack years: 5.00    Types: Cigarettes    Last attempt to quit: 06/07/2008    Years since quitting: 9.2  . Smokeless tobacco: Never Used  Substance Use Topics  . Alcohol use: Yes    Comment: rare     Current Outpatient Medications:  .  flintstones complete (FLINTSTONES) 60 MG chewable tablet, Chew 1 tablet by mouth daily., Disp: , Rfl:  .  meloxicam (MOBIC) 15 MG tablet, Take 1 tablet (15 mg  total) by mouth daily., Disp: 30 tablet, Rfl: 0 .  lidocaine (XYLOCAINE) 2 % jelly, Apply 1 application topically as needed. (Patient not taking: Reported on 09/11/2017), Disp: 30 mL, Rfl: 2 .  podofilox (CONDYLOX) 0.5 % gel, Apply to affected area every other night, wash off in the morning (Patient not taking: Reported on 09/11/2017), Disp: 3.5 g, Rfl: 2  Allergies  Allergen Reactions  . Morphine Nausea Only and Nausea And Vomiting    Patient states she gets really sick    ROS Constitutional: Negative for fever or weight change.  Respiratory: Negative for cough and shortness of breath.   Cardiovascular: Negative for chest pain or palpitations.  Gastrointestinal: See HPI. Musculoskeletal: Negative for gait problem or joint swelling.  Skin: Negative for rash.  Neurological: Negative for dizziness or headache.  No other specific complaints in a complete review of systems (except as listed in HPI above).  Objective  Vitals:   09/19/17 0812  BP: 118/72  Pulse: 99  Resp: 16  Temp: 98.6 F (37 C)  TempSrc: Oral  SpO2: 96%  Weight: 145 lb 8 oz (66 kg)  Height: 5\' 2"  (1.575 m)   Body mass index is 26.61 kg/m.  Nursing Note and Vital Signs reviewed.  Physical Exam  Abdominal:      Constitutional: Patient appears well-developed and well-nourished.  No distress.  HEENT: head atraumatic,  normocephalic Cardiovascular: Normal rate, regular rhythm, S1/S2 present.  No murmur or rub heard. No BLE edema. Pulmonary/Chest: Effort normal and breath sounds clear. No respiratory distress or retractions. Abdominal: Soft and tenderness present to right mid abdomen and RLQ as marked above.  No rebound, no HSM, negative Carnett's Signs today; bowel sounds present x4 quadrants.  No CVA Tenderness. Psychiatric: Patient has a normal mood and affect. behavior is normal. Judgment and thought content normal.  No results found for this or any previous visit (from the past 72 hour(s)).  Assessment &  Plan  1. Right sided abdominal pain - Ambulatory referral to Gastroenterology - CBC w/Diff/Platelet - BASIC METABOLIC PANEL WITH GFR - Lipase - Question abdominal etiology vs pelvic floor dysfunction vs MSK vs other etiology.  Pt has already seen GYN and orthopedics, we will refer to GI for additional evaluation, labs today.  Advised to avoid straining, continue regimen to prevent constipation. -Red flags and when to present for emergency care or RTC including fever >101.28F, chest pain, shortness of breath, new/worsening/un-resolving symptoms, severe pain, vomiting, diarrhea/constipation, blood in stool/dark and tarry stools, reviewed with patient at time of visit. Follow up and care instructions discussed and provided in AVS.

## 2017-09-20 ENCOUNTER — Encounter: Payer: Self-pay | Admitting: Gastroenterology

## 2017-09-20 ENCOUNTER — Other Ambulatory Visit: Payer: Self-pay

## 2017-09-20 ENCOUNTER — Ambulatory Visit (INDEPENDENT_AMBULATORY_CARE_PROVIDER_SITE_OTHER): Payer: BLUE CROSS/BLUE SHIELD | Admitting: Gastroenterology

## 2017-09-20 ENCOUNTER — Other Ambulatory Visit: Payer: Self-pay | Admitting: Gastroenterology

## 2017-09-20 VITALS — BP 145/96 | HR 102 | Temp 97.7°F | Ht 62.0 in | Wt 144.6 lb

## 2017-09-20 DIAGNOSIS — R1030 Lower abdominal pain, unspecified: Secondary | ICD-10-CM

## 2017-09-20 DIAGNOSIS — R1031 Right lower quadrant pain: Secondary | ICD-10-CM

## 2017-09-20 DIAGNOSIS — R1011 Right upper quadrant pain: Secondary | ICD-10-CM

## 2017-09-20 MED ORDER — DICYCLOMINE HCL 10 MG PO CAPS: 10.0000 mg | ORAL_CAPSULE | Freq: Three times a day (TID) | ORAL | 0 refills | Status: DC

## 2017-09-20 NOTE — Progress Notes (Signed)
Cephas Darby, MD [address]  [place]  [place],  [address]  Main: [phone]  Fax: [phone]    Gastroenterology Consultation  Referring Provider:     Hubbard Hartshorn, FNP Primary Care Physician:  Arnetha Courser, MD Primary Gastroenterologist:  Dr. Cephas Darby Reason for Consultation:     Right lower quadrant pain        HPI:   Veronica Jenkins is a 37 y.o. female referred by Dr. Sanda Klein, Satira Anis, MD  for consultation & management of right lower quadrant pain. Patient reports that, the pain started approximate a 2 weeks ago when she was trying to reach over a trash can, she felt a pop in her abdomen, has had pain just to the right of her umbilicus since then. Over the week ago, she was constipated and that has improved. She initially thought, maybe she pulled her muscle and she was on meloxicam which did not help. She saw her PCP for this who referred to GI further evaluation. She reports that she was dealing with right hip pain, saw or toe and is scheduled for a lower back MRI. She also saw OB in 04/2017 for right hip/right lower quadrant pain and had a transvaginal ultrasound which was normal.  She describes the pain as sporadic in onset, mostly dull, lasting from several minutes to a few hours, with no relation to food, laying on her right side and sitting aggravates the pain. She is also noticing that the pain is now radiating to her right groin as well. She denies bloating, nausea, vomiting. She denies weight loss, loss of appetite, fever, chills, night sweats  NSAIDs: Meloxicam as needed  Antiplts/Anticoagulants/Anti thrombotics: None  GI Procedures: None She denies having family history of IBD, GI malignancy She denies any GI surgeries She denies smoking or alcohol  No past medical history on file.  Past Surgical History:  Procedure Laterality Date  . CESAREAN SECTION      Prior to Admission medications   Medication Sig Start Date End Date  Taking? Authorizing Provider  flintstones complete (FLINTSTONES) 60 MG chewable tablet Chew 1 tablet by mouth daily.   Yes [provider]  meloxicam (MOBIC) 15 MG tablet Take 1 tablet (15 mg total) by mouth daily. 09/11/17  Yes Hubbard Hartshorn, FNP    Family History  Problem Relation Age of Onset  . Congestive Heart Failure Maternal Grandfather   . Heart disease Maternal Grandfather   . Diabetes Neg Hx   . Cancer Neg Hx   . Hypertension Neg Hx   . Stroke Neg Hx   . COPD Neg Hx      Social History   Tobacco Use  . Smoking status: Former Smoker    Packs/day: 0.50    Years: 10.00    Pack years: 5.00    Types: Cigarettes    Last attempt to quit: 06/07/2008    Years since quitting: 9.2  . Smokeless tobacco: Never Used  Substance Use Topics  . Alcohol use: Yes    Comment: rare  . Drug use: No    Allergies as of 09/20/2017 - Review Complete 09/20/2017  Allergen Reaction Noted  . Morphine Nausea Only and Nausea And Vomiting 09/08/2015    Review of Systems:    All systems reviewed and negative except where noted in HPI.   Physical Exam:  BP (!) 145/96   Pulse (!) 102   Temp 97.7 F (36.5 C) (Oral)  Ht '5\' 2"'  (1.575 m)   Wt 144 lb 9.6 oz (65.6 kg)   LMP 09/14/2017   BMI 26.45 kg/m  Patient's last menstrual period was 09/14/2017.  General:   Alert,  Well-developed, well-nourished, pleasant and cooperative in NAD Head:  Normocephalic and atraumatic. Eyes:  Sclera clear, no icterus.   Conjunctiva pink. Ears:  Normal auditory acuity. Nose:  No deformity, discharge, or lesions. Mouth:  No deformity or lesions,oropharynx pink & moist. Neck:  Supple; no masses or thyromegaly. Lungs:  Respirations even and unlabored.  Clear throughout to auscultation.   No wheezes, crackles, or rhonchi. No acute distress. Heart:  Regular rate and rhythm; no murmurs, clicks, rubs, or gallops. Abdomen:  Normal bowel sounds. Soft, mild tenderness in the right lower quadrant and  non-distended without masses, hepatosplenomegaly or hernias noted.  No guarding or rebound tenderness. , Carnett's sign positive Rectal: Not performed Msk:  Symmetrical without gross deformities. Good, equal movement & strength bilaterally. Pulses:  Normal pulses noted. Extremities:  No clubbing or edema.  No cyanosis. Neurologic:  Alert and oriented x3;  grossly normal neurologically. Skin:  Intact without significant lesions or rashes. No jaundice. Lymph Nodes:  No significant cervical adenopathy. Psych:  Alert and cooperative. Normal mood and affect.  Imaging Studies: None  Assessment and Plan:   Veronica Jenkins is a 37 y.o. Caucasian female with no significant past medical history, presents with 2 weeks history of right lower quadrant pain. Given that Carnett's test came back positive on my exam today, I recommend further evaluation with CT A/P and enterography. She had normal CBC, LFTs and lipase  - Trial of Bentyl 10 mg every 6 hours as needed - Check CRP, ESR - Further recommendations based on the above workup   Follow up in 2 weeks   Cephas Darby, MD

## 2017-09-25 ENCOUNTER — Encounter (HOSPITAL_COMMUNITY): Payer: Self-pay

## 2017-09-25 ENCOUNTER — Ambulatory Visit (HOSPITAL_COMMUNITY)
Admission: RE | Admit: 2017-09-25 | Discharge: 2017-09-25 | Disposition: A | Discharge status: Home / Self Care | Payer: BLUE CROSS/BLUE SHIELD | Source: Ambulatory Visit | Attending: Gastroenterology | Admitting: Gastroenterology

## 2017-09-25 DIAGNOSIS — R1030 Lower abdominal pain, unspecified: Secondary | ICD-10-CM | POA: Diagnosis not present

## 2017-09-25 DIAGNOSIS — M47818 Spondylosis without myelopathy or radiculopathy, sacral and sacrococcygeal region: Secondary | ICD-10-CM | POA: Insufficient documentation

## 2017-09-25 DIAGNOSIS — R1031 Right lower quadrant pain: Secondary | ICD-10-CM | POA: Insufficient documentation

## 2017-09-25 LAB — SEDIMENTATION RATE: Sed Rate: 4 mm/hr (ref 0–32)

## 2017-09-25 LAB — C-REACTIVE PROTEIN: CRP: 2.5 mg/L (ref 0.0–4.9)

## 2017-09-25 MED ORDER — IOPAMIDOL (ISOVUE-300) INJECTION 61%
INTRAVENOUS | Status: AC
  Filled 2017-09-25: qty 100

## 2017-09-25 MED ORDER — BARIUM SULFATE 0.1 % PO SUSP
ORAL | Status: AC
  Filled 2017-09-25: qty 3

## 2017-09-25 MED ORDER — IOPAMIDOL (ISOVUE-300) INJECTION 61%
100.0000 mL | Freq: Once | INTRAVENOUS | Status: AC | PRN
  Administered 2017-09-25: 100 mL via INTRAVENOUS

## 2017-09-26 ENCOUNTER — Telehealth: Payer: Self-pay | Admitting: *Deleted

## 2017-09-26 ENCOUNTER — Other Ambulatory Visit: Payer: Self-pay | Admitting: *Deleted

## 2017-09-26 DIAGNOSIS — A630 Anogenital (venereal) warts: Secondary | ICD-10-CM | POA: Diagnosis not present

## 2017-09-26 MED ORDER — PODOFILOX 0.5 % EX GEL: Freq: Two times a day (BID) | CUTANEOUS | 0 refills | Status: DC

## 2017-09-26 NOTE — Telephone Encounter (Signed)
Patient called and states a RX ( a gel )   [facility] . Patient never started the treatment . She is no longer with [facility] and would like for that RX to be sent to [facility] in [place]. If you have any questions. Please contact the patient her contact # [phone]. Please advise. Thank you

## 2017-09-26 NOTE — Telephone Encounter (Signed)
Done-ac 

## 2017-09-28 DIAGNOSIS — M5117 Intervertebral disc disorders with radiculopathy, lumbosacral region: Secondary | ICD-10-CM | POA: Diagnosis not present

## 2017-09-28 DIAGNOSIS — M5116 Intervertebral disc disorders with radiculopathy, lumbar region: Secondary | ICD-10-CM | POA: Diagnosis not present

## 2017-09-30 ENCOUNTER — Telehealth: Payer: BLUE CROSS/BLUE SHIELD | Admitting: Family

## 2017-09-30 DIAGNOSIS — B9689 Other specified bacterial agents as the cause of diseases classified elsewhere: Secondary | ICD-10-CM

## 2017-09-30 DIAGNOSIS — J329 Chronic sinusitis, unspecified: Secondary | ICD-10-CM

## 2017-09-30 MED ORDER — AMOXICILLIN-POT CLAVULANATE 875-125 MG PO TABS: 1.0000 | ORAL_TABLET | Freq: Two times a day (BID) | ORAL | 0 refills | Status: AC

## 2017-09-30 NOTE — Progress Notes (Signed)

## 2017-10-03 DIAGNOSIS — M5116 Intervertebral disc disorders with radiculopathy, lumbar region: Secondary | ICD-10-CM | POA: Diagnosis not present

## 2017-10-03 DIAGNOSIS — M5136 Other intervertebral disc degeneration, lumbar region: Secondary | ICD-10-CM | POA: Diagnosis not present

## 2017-10-03 DIAGNOSIS — M25551 Pain in right hip: Secondary | ICD-10-CM | POA: Diagnosis not present

## 2017-10-07 ENCOUNTER — Ambulatory Visit: Payer: BLUE CROSS/BLUE SHIELD | Admitting: Gastroenterology

## 2017-10-16 DIAGNOSIS — D280 Benign neoplasm of vulva: Secondary | ICD-10-CM | POA: Diagnosis not present

## 2017-12-02 ENCOUNTER — Encounter: Payer: Self-pay | Admitting: Obstetrics and Gynecology

## 2017-12-03 ENCOUNTER — Other Ambulatory Visit: Payer: Self-pay | Admitting: Obstetrics and Gynecology

## 2017-12-03 MED ORDER — NORGESTIMATE-ETH ESTRADIOL 0.25-35 MG-MCG PO TABS: 1.0000 | ORAL_TABLET | Freq: Every day | ORAL | 0 refills | Status: DC

## 2017-12-10 DIAGNOSIS — B958 Unspecified staphylococcus as the cause of diseases classified elsewhere: Secondary | ICD-10-CM | POA: Diagnosis not present

## 2017-12-10 DIAGNOSIS — L0889 Other specified local infections of the skin and subcutaneous tissue: Secondary | ICD-10-CM | POA: Diagnosis not present

## 2018-01-01 ENCOUNTER — Encounter: Payer: Self-pay | Admitting: Emergency Medicine

## 2018-01-01 ENCOUNTER — Other Ambulatory Visit: Payer: Self-pay

## 2018-01-01 ENCOUNTER — Ambulatory Visit
Admission: EM | Admit: 2018-01-01 | Discharge: 2018-01-01 | Disposition: A | Discharge status: Home / Self Care | Payer: BLUE CROSS/BLUE SHIELD | Attending: Family Medicine | Admitting: Family Medicine

## 2018-01-01 DIAGNOSIS — R002 Palpitations: Secondary | ICD-10-CM | POA: Diagnosis not present

## 2018-01-01 DIAGNOSIS — F411 Generalized anxiety disorder: Secondary | ICD-10-CM | POA: Diagnosis not present

## 2018-01-01 DIAGNOSIS — R5383 Other fatigue: Secondary | ICD-10-CM | POA: Diagnosis not present

## 2018-01-01 DIAGNOSIS — Z885 Allergy status to narcotic agent status: Secondary | ICD-10-CM | POA: Diagnosis not present

## 2018-01-01 DIAGNOSIS — Z87891 Personal history of nicotine dependence: Secondary | ICD-10-CM | POA: Diagnosis not present

## 2018-01-01 HISTORY — DX: Anxiety disorder, unspecified: F41.9

## 2018-01-01 LAB — BASIC METABOLIC PANEL
Anion gap: 9 (ref 5–15)
BUN: 11 mg/dL (ref 6–20)
CO2: 25 mmol/L (ref 22–32)
Calcium: 9 mg/dL (ref 8.9–10.3)
Chloride: 105 mmol/L (ref 101–111)
Creatinine, Ser: 0.62 mg/dL (ref 0.44–1.00)
GFR calc Af Amer: >60 mL/min (ref >60)
GFR calc non Af Amer: >60 mL/min (ref >60)
Glucose, Bld: 108 mg/dL — ABNORMAL HIGH (ref 65–99)
Potassium: 3.9 mmol/L (ref 3.5–5.1)
Sodium: 139 mmol/L (ref 135–145)

## 2018-01-01 LAB — CBC WITH DIFFERENTIAL/PLATELET
Basophils Absolute: 0 10*3/uL (ref 0–0.1)
Basophils Relative: 0 %
Eosinophils Absolute: 0.1 10*3/uL (ref 0–0.7)
Eosinophils Relative: 1 %
HCT: 38.5 % (ref 35.0–47.0)
Hemoglobin: 12.9 g/dL (ref 12.0–16.0)
Lymphocytes Relative: 11 %
Lymphs Abs: 1 10*3/uL (ref 1.0–3.6)
MCH: 28 pg (ref 26.0–34.0)
MCHC: 33.5 g/dL (ref 32.0–36.0)
MCV: 83.7 fL (ref 80.0–100.0)
Monocytes Absolute: 0.5 10*3/uL (ref 0.2–0.9)
Monocytes Relative: 5 %
Neutro Abs: 7.6 10*3/uL — ABNORMAL HIGH (ref 1.4–6.5)
Neutrophils Relative %: 83 %
Platelets: 267 10*3/uL (ref 150–440)
RBC: 4.6 MIL/uL (ref 3.80–5.20)
RDW: 14.2 % (ref 11.5–14.5)
WBC: 9.2 10*3/uL (ref 3.6–11.0)

## 2018-01-01 LAB — TSH: TSH: 1.403 u[IU]/mL (ref 0.350–4.500)

## 2018-01-01 MED ORDER — CLONAZEPAM 0.5 MG PO TABS: 0.2500 mg | ORAL_TABLET | Freq: Two times a day (BID) | ORAL | 0 refills | Status: DC | PRN

## 2018-01-01 NOTE — ED Provider Notes (Addendum)
[facility]    CSN: [id number] Arrival date & time: 01/01/18  1125     History   Chief Complaint Chief Complaint  Patient presents with  . Palpitations    HPI Veronica Jenkins is a 37 y.o. female.   HPI  37 year old female accompanied by her husband presents with feelings of palpitations dry mouth numbness shaking and fatigue that started today.  Very similar attack about 2 weeks ago.  She has a history of anxiety disorder.  The Zoloft had been arrived and she had a filled but never started taking it because she did not want to become dependent on it.  He took 5 mg diazepam this morning that she had obtained from a dentist back in 2017.  She took another the other dose of diazepam 2 weeks ago which seemed to help the feelings better than it did today.  Does state that she is under a lot of stress.  Does not appear overly anxious.She does worry about things a lot.  She has no suicidal ideation or homicidal ideation.        Past Medical History:  Diagnosis Date  . Anxiety     Patient Active Problem List   Diagnosis Date Noted  . Preventative health care 06/19/2017  . Anxiety about health 05/06/2017  . Right hip pain 05/06/2017  . Adolescent idiopathic scoliosis of lumbar region 09/08/2015  . Overweight (BMI 25.0-29.9) 06/08/2015    Past Surgical History:  Procedure Laterality Date  . CESAREAN SECTION      OB History    Gravida  2   Para  2   Term      Preterm      AB      Living        SAB      TAB      Ectopic      Multiple      Live Births               Home Medications    Prior to Admission medications   Medication Sig Start Date End Date Taking? Authorizing Provider  diazepam (VALIUM) 5 MG tablet Take 5 mg by mouth every 6 (six) hours as needed for anxiety.   Yes [provider]  UNABLE TO FIND  daily   Yes [provider]  clonazePAM (KLONOPIN) 0.5 MG tablet Take 0.5 tablets (0.25 mg total) by mouth  2 (two) times daily as needed for anxiety. 01/01/18   Lutricia Feil, PA-C    Family History Family History  Problem Relation Age of Onset  . Congestive Heart Failure Maternal Grandfather   . Heart disease Maternal Grandfather   . Diabetes Neg Hx   . Cancer Neg Hx   . Hypertension Neg Hx   . Stroke Neg Hx   . COPD Neg Hx     Social History Social History   Tobacco Use  . Smoking status: Former Smoker    Packs/day: 0.50    Years: 10.00    Pack years: 5.00    Types: Cigarettes    Last attempt to quit: 06/07/2008    Years since quitting: 9.5  . Smokeless tobacco: Never Used  Substance Use Topics  . Alcohol use: Yes    Comment: rare  . Drug use: No     Allergies   Morphine   Review of Systems Review of Systems  Constitutional: Positive for activity change. Negative for appetite change, chills, fatigue and fever.  Psychiatric/Behavioral: Negative for self-injury, sleep disturbance and suicidal ideas. The patient is nervous/anxious.   All other systems reviewed and are negative.    Physical Exam Triage Vital Signs ED Triage Vitals [01/01/18 1133]  Enc Vitals Group     BP (!) 147/97     Pulse Rate (!) 111     Resp 16     Temp 98.5 F (36.9 C)     Temp Source Oral     SpO2 100 %     Weight 145 lb (65.8 kg)     Height  (1.575 m)     Head Circumference      Peak Flow      Pain Score 0     Pain Loc      Pain Edu?      Excl. in GC?    No data found.  Updated Vital Signs BP (!) 147/97 (BP Location: Left Arm)   Pulse (!) 111   Temp 98.5 F (36.9 C) (Oral)   Resp 16   Ht  (1.575 m)   Wt 145 lb (65.8 kg)   LMP 12/27/2017 (Approximate)   SpO2 100%   BMI 26.52 kg/m   Visual Acuity Right Eye Distance:   Left Eye Distance:   Bilateral Distance:    Right Eye Near:   Left Eye Near:    Bilateral Near:     Physical Exam  Constitutional: She is oriented to person, place, and time. She appears well-developed and well-nourished. No distress.    HENT:  Head: Normocephalic.  Eyes: Pupils are equal, round, and reactive to light. Right eye exhibits no discharge. Left eye exhibits no discharge.  Neck: Normal range of motion.  Cardiovascular: Normal rate, regular rhythm and normal heart sounds. Exam reveals no gallop and no friction rub.  No murmur heard. Pulmonary/Chest: Effort normal and breath sounds normal.  Musculoskeletal: Normal range of motion.  Neurological: She is alert and oriented to person, place, and time.  Skin: Skin is warm and dry. She is not diaphoretic.  Psychiatric: She has a normal mood and affect. Her behavior is normal. Judgment and thought content normal.  Nursing note and vitals reviewed.    UC Treatments / Results  Labs (all labs ordered are listed, but only abnormal results are displayed) Labs Reviewed  CBC WITH DIFFERENTIAL/PLATELET - Abnormal; Notable for the following components:      Result Value   Neutro Abs 7.6 (*)    All other components within normal limits  BASIC METABOLIC PANEL - Abnormal; Notable for the following components:   Glucose, Bld 108 (*)    All other components within normal limits  TSH    EKG ED ECG REPORT   Date: 01/01/2018  EKG Time: 1:06 PM  Rate:79  Rhythm: normal sinus rhythm,  Axis: normal  Intervals:none  ST&T Change: None  Narrative Interpretation: Normal sinus rhythm without acute changes           Radiology No results found.  Procedures Procedures (including critical care time)  Medications Ordered in UC Medications - No data to display  Initial Impression / Assessment and Plan / UC Course  I have reviewed the triage vital signs and the nursing notes.  Pertinent labs & imaging results that were available during my care of the patient were reviewed by me and considered in my medical decision making (see chart for details).     Plan: 1. Test/x-ray results and diagnosis reviewed with patient 2. rx as per  orders; risks, benefits, potential side  effects reviewed with patient 3. Recommend supportive treatment with initiation of the Zoloft on a daily basis.  Order to bridge her anxiety feelings while the Zoloft is taking effect I have will prescribe clonazepam 0.25 mg twice daily as necessary.  Will not take diazepam any longer.  Will need to follow-up with her primary care physician in a week 4. F/u prn if symptoms worsen or don't improve  Final Clinical Impressions(s) / UC Diagnoses   Final diagnoses:  GAD (generalized anxiety disorder)     Discharge Instructions     Start Taking Lexapro 10 mg daily.  Arrange a follow-up with your primary care physician in 1 week.    ED Prescriptions    Medication Sig Dispense Auth. Provider   clonazePAM (KLONOPIN) 0.5 MG tablet Take 0.5 tablets (0.25 mg total) by mouth 2 (two) times daily as needed for anxiety. 20 tablet Lutricia Feil, PA-C     Controlled Substance Prescriptions [place] Controlled Substance Registry consulted? She has one listed medication in 2017 of diazepam prescribed by a dentist.  No other medications were listed.   Lutricia Feil, PA-C 01/01/18 1304    Lutricia Feil, PA-C 01/01/18 1306

## 2018-01-01 NOTE — Discharge Instructions (Signed)
Start Taking Lexapro 10 mg daily.  Arrange a follow-up with your primary care physician in 1 week.

## 2018-01-01 NOTE — ED Triage Notes (Signed)
Patient in today c/o palpitations, dry mouth, numbness, shakiness and fatigue x today. She does have a history of anxiety and thinks that this is what may be going on. Patient has had this twice in the last 2 weeks. Patient has taken Diazepam and CBD oil this morning.

## 2018-01-02 ENCOUNTER — Other Ambulatory Visit: Payer: Self-pay

## 2018-01-02 ENCOUNTER — Ambulatory Visit
Admission: EM | Admit: 2018-01-02 | Discharge: 2018-01-02 | Disposition: A | Discharge status: Home / Self Care | Payer: BLUE CROSS/BLUE SHIELD | Attending: Family Medicine | Admitting: Family Medicine

## 2018-01-02 ENCOUNTER — Encounter: Payer: Self-pay | Admitting: Emergency Medicine

## 2018-01-02 DIAGNOSIS — F419 Anxiety disorder, unspecified: Secondary | ICD-10-CM

## 2018-01-02 MED ORDER — BUSPIRONE HCL 7.5 MG PO TABS: 7.5000 mg | ORAL_TABLET | Freq: Two times a day (BID) | ORAL | 0 refills | Status: DC

## 2018-01-02 NOTE — ED Triage Notes (Signed)
Patient states she was here yesterday and her symptoms got better but now they are worse, racing heart, numbness and tingling all over

## 2018-01-02 NOTE — ED Provider Notes (Signed)
[facility]    CSN: [id number] Arrival date & time: 01/02/18  1300  History   Chief Complaint Chief Complaint  Patient presents with  . Numbness   HPI   37 year old female presents with several complaints.  Patient was seen yesterday for the same complaints.  She was treated with clonazepam.  She was instructed to start Lexapro.  Patient presents today with continued complaints of diffuse numbness, palpitations, shaking, fatigue, dry mouth.  Patient states that she has taken her Lexapro as of this morning.  She has taken Klonopin as well.  She had some improvement briefly but then her symptoms recurred.  She reports headache as well.  Patient endorses stress but does not give much detail.  She is accompanied by her mother today.  Patient is concerned that she has an underlying medical cause for her symptoms.  She is concerned about heart attack and stroke.  Past Medical History:  Diagnosis Date  . Anxiety    Patient Active Problem List   Diagnosis Date Noted  . Preventative health care 06/19/2017  . Anxiety about health 05/06/2017  . Right hip pain 05/06/2017  . Adolescent idiopathic scoliosis of lumbar region 09/08/2015  . Overweight (BMI 25.0-29.9) 06/08/2015    Past Surgical History:  Procedure Laterality Date  . CESAREAN SECTION      OB History    Gravida  2   Para  2   Term      Preterm      AB      Living        SAB      TAB      Ectopic      Multiple      Live Births               Home Medications    Prior to Admission medications   Medication Sig Start Date End Date Taking? Authorizing Provider  clonazePAM (KLONOPIN) 0.5 MG tablet Take 0.5 tablets (0.25 mg total) by mouth 2 (two) times daily as needed for anxiety. 01/01/18  Yes Lutricia Feil, PA-C  busPIRone (BUSPAR) 7.5 MG tablet Take 1 tablet (7.5 mg total) by mouth 2 (two) times daily. 01/02/18   Tommie Sams, DO  UNABLE TO FIND  daily    [provider]     Family History Family History  Problem Relation Age of Onset  . Congestive Heart Failure Maternal Grandfather   . Heart disease Maternal Grandfather   . Diabetes Neg Hx   . Cancer Neg Hx   . Hypertension Neg Hx   . Stroke Neg Hx   . COPD Neg Hx     Social History Social History   Tobacco Use  . Smoking status: Former Smoker    Packs/day: 0.50    Years: 10.00    Pack years: 5.00    Types: Cigarettes    Last attempt to quit: 06/07/2008    Years since quitting: 9.5  . Smokeless tobacco: Never Used  Substance Use Topics  . Alcohol use: Yes    Comment: rare  . Drug use: No     Allergies   Morphine   Review of Systems Review of Systems  Constitutional: Positive for fatigue.  HENT:       Dry mouth.  Cardiovascular: Positive for palpitations.  Neurological: Positive for numbness and headaches.   Physical Exam Triage Vital Signs ED Triage Vitals  Enc Vitals Group     BP 01/02/18 1321 (!) 138/97  Pulse Rate 01/02/18 1321 (!) 115     Resp 01/02/18 1321 16     Temp 01/02/18 1321 98.1 F (36.7 C)     Temp Source 01/02/18 1321 Oral     SpO2 01/02/18 1321 100 %     Weight 01/02/18 1319 145 lb (65.8 kg)     Height 01/02/18 1319  (1.575 m)     Head Circumference --      Peak Flow --      Pain Score 01/02/18 1319 6     Pain Loc --      Pain Edu? --      Excl. in GC? --    Updated Vital Signs BP (!) 138/97 (BP Location: Left Arm)   Pulse (!) 115   Temp 98.1 F (36.7 C) (Oral)   Resp 16   Ht  (1.575 m)   Wt 145 lb (65.8 kg)   LMP 12/27/2017 (Approximate)   SpO2 100%   BMI 26.52 kg/m   Visual Acuity Right Eye Distance:   Left Eye Distance:   Bilateral Distance:    Right Eye Near:   Left Eye Near:    Bilateral Near:     Physical Exam  Constitutional: She appears well-developed. No distress.  HENT:  Head: Normocephalic and atraumatic.  Nose: Nose normal.  Eyes: Conjunctivae are normal. Right eye exhibits no discharge. Left eye  exhibits no discharge.  Cardiovascular: Regular rhythm.  Tachycardia.  Pulmonary/Chest: Effort normal and breath sounds normal. She has no wheezes. She has no rales.  Neurological: She is alert.  No apparent neurological deficits.  Psychiatric:  Flat affect.  Depressed mood.   Nursing note and vitals reviewed.  UC Treatments / Results  Labs (all labs ordered are listed, but only abnormal results are displayed) Labs Reviewed - No data to display  EKG None  Radiology No results found.  Procedures Procedures (including critical care time)  Medications Ordered in UC Medications - No data to display  Initial Impression / Assessment and Plan / UC Course  I have reviewed the triage vital signs and the nursing notes.  Pertinent labs & imaging results that were available during my care of the patient were reviewed by me and considered in my medical decision making (see chart for details).    37 year old female presents with continued symptoms which appear to be from anxiety.  She had a work-up yesterday and her exam is unrevealing today.  This is most consistent with generalized anxiety and panic.  Reassurance was provided.  Her mother requested that she be prescribed an alternative agent.  The patient stated that she did not want to take another medication.  I sent a prescription for BuSpar in case she changed her mind.  Continue Klonopin as needed.  Continue Lexapro.  I advised her to seek help from a counselor.  She is amenable to this.  Final Clinical Impressions(s) / UC Diagnoses   Final diagnoses:  Anxiety     Discharge Instructions     Please consider seeing a counselor (try [facility]).  Continue the lexapro.  Consider Buspar.  Follow up with your PCP.  Take care  Dr. Adriana Simas    ED Prescriptions    Medication Sig Dispense Auth. Provider   busPIRone (BUSPAR) 7.5 MG tablet Take 1 tablet (7.5 mg total) by mouth 2 (two) times daily. 60 tablet Tommie Sams,  DO     Controlled Substance Prescriptions [place] Controlled Substance Registry consulted? Not Applicable  Tommie Sams, DO 01/02/18 1440

## 2018-01-02 NOTE — Discharge Instructions (Signed)
Please consider seeing a counselor (try [facility]).  Continue the lexapro.  Consider Buspar.  Follow up with your PCP.  Take care  Dr. Adriana Simas

## 2018-01-03 ENCOUNTER — Ambulatory Visit: Payer: Self-pay | Admitting: *Deleted

## 2018-01-03 ENCOUNTER — Telehealth (HOSPITAL_COMMUNITY): Payer: Self-pay

## 2018-01-03 ENCOUNTER — Encounter: Payer: Self-pay | Admitting: Family Medicine

## 2018-01-03 ENCOUNTER — Telehealth: Payer: Self-pay

## 2018-01-03 ENCOUNTER — Ambulatory Visit (INDEPENDENT_AMBULATORY_CARE_PROVIDER_SITE_OTHER): Payer: BLUE CROSS/BLUE SHIELD | Admitting: Family Medicine

## 2018-01-03 VITALS — BP 132/86 | HR 94 | Temp 98.0°F | Resp 14 | Ht 62.0 in | Wt 143.7 lb

## 2018-01-03 DIAGNOSIS — F418 Other specified anxiety disorders: Secondary | ICD-10-CM | POA: Diagnosis not present

## 2018-01-03 DIAGNOSIS — R000 Tachycardia, unspecified: Secondary | ICD-10-CM | POA: Diagnosis not present

## 2018-01-03 MED ORDER — METOPROLOL SUCCINATE ER 25 MG PO TB24: 12.5000 mg | ORAL_TABLET | Freq: Every day | ORAL | 1 refills | Status: DC

## 2018-01-03 NOTE — Patient Instructions (Addendum)
Let's get labs today Let's get you to the cardiologist Start the beta-blocker Someone is always on-call if you need to talk or get advice  Sinus Tachycardia Sinus tachycardia is a kind of fast heartbeat. In sinus tachycardia, the heart beats more than 100 times a minute. Sinus tachycardia starts in a part of the heart called the sinus node. Sinus tachycardia may be harmless, or it may be a sign of a serious condition. What are the causes? This condition may be caused by:  Exercise or exertion.  A fever.  Pain.  Loss of body fluids (dehydration).  Severe bleeding (hemorrhage).  Anxiety and stress.  Certain substances, including: ? Alcohol. ? Caffeine. ? Tobacco and nicotine products. ? Diet pills. ? Illegal drugs.  Medical conditions including: ? Heart disease. ? An infection. ? An overactive thyroid (hyperthyroidism). ? A lack of red blood cells (anemia).  What are the signs or symptoms? Symptoms of this condition include:  A feeling that the heart is beating quickly (palpitations).  Suddenly noticing your heartbeat (cardiac awareness).  Dizziness.  Tiredness (fatigue).  Shortness of breath.  Chest pain.  Nausea.  Fainting.  How is this diagnosed? This condition is diagnosed with:  A physical exam.  Other tests, such as: ? Blood tests. ? An electrocardiogram (ECG). This test measures the electrical activity of the heart. ? Holter monitoring. For this test, you wear a device that records your heartbeat for one or more days.  You may be referred to a heart specialist (cardiologist). How is this treated? Treatment for this condition depends on the cause or underlying condition. Treatment may involve:  Treating the underlying condition.  Taking new medicines or changing your current medicines as told by your health care provider.  Making changes to your diet or lifestyle.  Practicing relaxation methods.  Follow these instructions at  home: Lifestyle  Do not use any products that contain nicotine or tobacco, such as cigarettes and e-cigarettes. If you need help quitting, ask your health care provider.  Learn relaxation methods, like deep breathing, to help you when you get stressed or anxious.  Do not use illegal drugs, such as cocaine.  Do not abuse alcohol. Limit alcohol intake to no more than 1 drink a day for non-pregnant women and 2 drinks a day for men. One drink equals 12 oz of beer, 5 oz of wine, or 1 oz of hard liquor.  Find time to rest and relax often. This reduces stress.  Avoid: ? Caffeine. ? Stimulants such as over-the-counter diet pills or pills that help you to stay awake. ? Situations that cause anxiety or stress. General instructions  Drink enough fluids to keep your urine clear or pale yellow.  Take over-the-counter and prescription medicines only as told by your health care provider.  Keep all follow-up visits as told by your health care provider. This is important. Contact a health care provider if:  You have a fever.  You have vomiting or diarrhea that keeps happening (is persistent). Get help right away if:  You have pain in your chest, upper arms, jaw, or neck.  You become weak or dizzy.  You feel faint.  You have palpitations that do not go away. This information is not intended to replace advice given to you by your health care provider. Make sure you discuss any questions you have with your health care provider. Document Released: 09/20/2004 Document Revised: 03/10/2016 Document Reviewed: 02/25/2015 Elsevier Interactive Patient Education  2018 [employer]. Urine Metanephrines Test  Why am I having this test? Metanephrines are substances that result from the breakdown of two hormones, epinephrine and norepinephrine. These hormones are made by a pair of glands located on top of your kidneys (adrenal glands). Adrenal glands release these hormones during times of stress. After  they are released, epinephrine and norepinephrine break down into metanephrine and normetanephrine and leave your body in your urine. It is normal to find a small amount of metanephrines in urine. However, you may have a large amount of metanephrines in your urine if you have a rare type of adrenal gland tumor (pheochromocytoma). A pheochromocytoma is not usually cancer, but this tumor creates large amounts of epinephrine and norepinephrine. You may have this test if you have been treated for a pheochromocytoma or if you have symptoms of having one. These include:  High blood pressure.  Headaches.  Rapid heartbeat.  Sweating.  What kind of sample is taken? This test requires a urine sample. Your health care provider will give you a container to collect all the urine you produce over 24 hours. Follow your health care provider's instructions on how to collect the sample. Keep the container refrigerated. How do I prepare for this test? Many medicines, some foods, and heavy exercise can increase urinary metanephrines.  Let your health care provider know about all medicines you are taking, including vitamins, herbs, eye drops, creams, and over-the-counter medicines. You may need to stop taking them before the test.  Do not exercise heavily before and during the test as directed by your health care provider.  Talk to your health care provider about what foods or beverages you can or cannot eat or drink before and during the test. Follow all of your health care provider's directions.  What do the results mean? Your results will be compared with a reference range of values for this test. The reference ranges for the urine metanephrines test are as follows:  Metanephrine: Less than 1.3 mg/24 hr or less than 7 micromoles/day (SI units).  Normetanephrine: 15-80 mcg/24 hr or 89-473 nmol/day (SI units).  Abnormally high levels of metanephrine may mean:  You have a pheochromocytoma.  You have a  pheochromocytoma that has returned after treatment.  It could also mean that your test result was a false positive. This happens when something else causes your metanephrines to go up. Stress or certain medicines can make this happen. Your health care provider may do more tests to confirm a diagnosis of pheochromocytoma. If your metanephrine levels are low, you are unlikely to have a pheochromocytoma. Reference rangesmay vary among different labs and hospitals. Talk to your health care provider to discuss your results, treatment options, and if necessary, the need for more tests. It is your responsibility to obtain your test results. Ask the lab or department performing the test when and how you will get your results. Talk with your health care provider if you have any questions about your results. Talk with your health care provider to discuss your results, treatment options, and if necessary, the need for more tests. Talk with your health care provider if you have any questions about your results. This information is not intended to replace advice given to you by your health care provider. Make sure you discuss any questions you have with your health care provider. Document Released: 09/07/2004 Document Revised: 04/18/2016 Document Reviewed: 12/09/2013 Elsevier Interactive Patient Education  2018 [employer].   Steps to Elicit the Relaxation Response The following is the technique reprinted with permission  from Dr. Billy Fischer book The Relaxation Response pages 162-163 1. Sit quietly in a comfortable position. 2. Close your eyes. 3. Deeply relax all your muscles,  beginning at your feet and progressing up to your face.  Keep them relaxed. 4. Breathe through your nose.  Become aware of your breathing.  As you breathe out, say the word, "one"*,  silently to yourself. For example,  breathe in ... out, "one",- in .. out, "one", etc.  Breathe easily and naturally. 5. Continue for 10 to  20 minutes.  You may open your eyes to check the time, but do not use an alarm.  When you finish, sit quietly for several minutes,  at first with your eyes closed and later with your eyes opened.  Do not stand up for a few minutes. 6. Do not worry about whether you are successful  in achieving a deep level of relaxation.  Maintain a passive attitude and permit relaxation to occur at its own pace.  When distracting thoughts occur,  try to ignore them by not dwelling upon them  and return to repeating "one."  With practice, the response should come with little effort.  Practice the technique once or twice daily,  but not within two hours after any meal,  since the digestive processes seem to interfere with  the elicitation of the Relaxation Response. * It is better to use a soothing, mellifluous sound, preferably with no meaning. or association, to avoid stimulation of unnecessary thoughts - a mantra.

## 2018-01-03 NOTE — Progress Notes (Signed)
BP 132/86   Pulse 94   Temp 98 F (36.7 C) (Oral)   Resp 14   Ht  (1.575 m)   Wt 143 lb 11.2 oz (65.2 kg)   LMP 12/27/2017 (Approximate)   SpO2 97%   BMI 26.28 kg/m    Subjective:    Patient ID: Veronica Jenkins, female    DOB: [birth year], 37 y.o.   MRN: [id number]  HPI: Veronica Jenkins is a 38 y.o. female  Chief Complaint  Patient presents with  . Tachycardia    tingling, cotton mouth, anxiety.  Pt has been seen at urgent care twice and they are telling her anxiety    HPI  She woke up 3 weeks ago; felt heart racing; [place] rangers [place] her [place], pulse 144; then came down to 100; went home and took a diazepam and things settled down; Wednesday, started again; husband came and got her from work; took another diazepam but that didn't help; went to urgent care; PA told her panic attack, start lexapro and klonopin Did start to feel better Wed and things settled down Come Thursday after waking up, went downhill; took the lexapro and 0.25 mg of klonopin (first dose) made him a zombie, in and out of sleepiness, then by afternoon, heart was still racing; never helped the heart rate, just made her feel like a zombie; BP was through the roof; 160/90 something; back to urgent care; still told that it was anxiety She does have a lot going on in her life right now; but has had a lot going on in her life and not reacted like this She is not saying that it's not anxiety; she feels like if she could get the heart racing to stop She feels warm and flushed; gets tingly down the arms Worries about heart attack and stroke No excessive sweating Had headache yesterday; did not drink coffee yesterday; had it this morning, no headache yesterday No change in getting up at night to urinate When she gets up to do something, everything just skyrockets; feels very jittery and shaky and unfocused; everything is just ramped up Just sitting there yesterday, thought about watering the garden;  every thing got heightened and she had to lay down Felt unsteady getting to the bathroom last night; every thing just sitting up in the bed, couldn't get back to bed fast enough Breathing normal, not hyperventilating; worrying about the symptoms If she could get the heart rate to chill, then every thing else would chill No fam hx of hyperthyroidism; no weight loss recently; stools are formed;  She does not take want to take the lexapro; this whole situational is stressful, since Wednesday feels defeated, "I'm not myself" and frustrated Reviewed both notes; BP and pulse up  Depression screen [facility] 2/9 01/03/2018 06/19/2017 04/26/2017 01/10/2017 09/03/2016  Decreased Interest 0 0 0 0 0  Down, Depressed, Hopeless 0 0 0 0 0  PHQ - 2 Score 0 0 0 0 0    Relevant past medical, surgical, family and social history reviewed Past Medical History:  Diagnosis Date  . Anxiety    Past Surgical History:  Procedure Laterality Date  . CESAREAN SECTION     Family History  Problem Relation Age of Onset  . Congestive Heart Failure Maternal Grandfather   . Heart disease Maternal Grandfather   . Diabetes Neg Hx   . Cancer Neg Hx   . Hypertension Neg Hx   . Stroke Neg Hx   .  COPD Neg Hx    Social History   Tobacco Use  . Smoking status: Former Smoker    Packs/day: 0.50    Years: 10.00    Pack years: 5.00    Types: Cigarettes    Last attempt to quit: 06/07/2008    Years since quitting: 9.6  . Smokeless tobacco: Never Used  Substance Use Topics  . Alcohol use: Yes    Comment: rare  . Drug use: No    Interim medical history since last visit reviewed. Allergies and medications reviewed  Review of Systems Per HPI unless specifically indicated above     Objective:    BP 132/86   Pulse 94   Temp 98 F (36.7 C) (Oral)   Resp 14   Ht  (1.575 m)   Wt 143 lb 11.2 oz (65.2 kg)   LMP 12/27/2017 (Approximate)   SpO2 97%   BMI 26.28 kg/m    Physical Exam  Constitutional: She appears  well-developed and well-nourished.  Eyes: EOM are normal.  Cardiovascular: Normal rate and regular rhythm.  No extrasystoles are present.  No murmur heard. Pulmonary/Chest: Effort normal and breath sounds normal.  Neurological: She displays no tremor.  Reflex Scores:      Patellar reflexes are 2+ on the right side and 2+ on the left side. Psychiatric: Her behavior is normal. Judgment and thought content normal. Her mood appears anxious. Her speech is not rapid and/or pressured. Cognition and memory are normal.       Assessment & Plan:   Problem List Items Addressed This Visit      Other   Anxiety about health    Discussed current symptoms, anxiety in regards to her health; see AVS for tips on dealing with anxiety      Relevant Medications   escitalopram (LEXAPRO) 10 MG tablet    Other Visit Diagnoses    Tachycardia    -  Primary   more than 25 minutes face-to-face; discussed ddx; plan of action, multiple labs, holter monitor, cardiology referral EKG today, see copy; to ER if worse   Relevant Orders   EKG 12-Lead (Completed)   Ambulatory referral to Cardiology   Holter monitor - 24 hour   T4, free (Completed)   T3, free (Completed)   Magnesium (Completed)   Basic Metabolic Panel (BMET) (Completed)   Metanephrines, Urine, 24 hour (Completed)   Catecholamines, fractionated, Urine, 24 hour   VMA, urine, 24 hour       Follow up plan: Return in about 1 week (around 01/10/2018).  An after-visit summary was printed and given to the patient at check-out.  Please see the patient instructions which may contain other information and recommendations beyond what is mentioned above in the assessment and plan.  Meds ordered this encounter  Medications  . metoprolol succinate (TOPROL-XL) 25 MG 24 hr tablet    Sig: Take 0.5 tablets (12.5 mg total) by mouth daily. May repeat another 0.5 mg tablet per 24 hours if heart rate >100 and BP >130    Dispense:  30 tablet    Refill:  1     Orders Placed This Encounter  Procedures  . T4, free  . T3, free  . Magnesium  . Basic Metabolic Panel (BMET)  . Metanephrines, Urine, 24 hour  . Catecholamines, fractionated, Urine, 24 hour  . VMA, urine, 24 hour  . Catecholamines, Frac w/VMA, 24H Ur  . Ambulatory referral to Cardiology  . Holter monitor - 24 hour  .  EKG 12-Lead   Face-to-face time with patient was more than 25 minutes, >50% time spent counseling and coordination of care

## 2018-01-03 NOTE — Telephone Encounter (Signed)
Copied from CRM [id number]. Topic: Referral - Question >> Jan 03, 2018  1:16 PM Veronica Jenkins wrote: Reason for CRM: pt was referred to Cardiology today 01/03/18. They called and she cant be seen until July. She is wanting to know if there is another cardiology office she could be referred  to that could get her in sooner.   I contacted this patient to inform her that she can try the other heart care locations or we can send it to [facility]. She went with [facility] since it is in [place] and they usually have more availabilities.  I tried to contact their office to schedule an appointment but they have closed for the day. I will try back on Monday.

## 2018-01-03 NOTE — Telephone Encounter (Signed)
TSH is normal, attempted to follow up with patient, no answer at this time. Message sent in MyChart.

## 2018-01-03 NOTE — Telephone Encounter (Signed)
Pt called with her heart "racing". States that it is constantly beating fast, even waking up with it. Does not feel dizzy but unfocused, and tingling in her arms and legs.  Has been seen at [facility], for this and now requesting an appointment.  Denies cardiac history. Has been treated for anxiety. Appointment already scheduled for this morning. Will route to flow at [facility]. Pt advised to call back for worsening symptoms.  Reason for Disposition . Problems with anxiety or stress  Answer Assessment - Initial Assessment Questions 1. DESCRIPTION: "Please describe your heart rate or heart beat that you are having" (e.g., fast/slow, regular/irregular, skipped or extra beats, "palpitations")     Fast, racing  2. ONSET: "When did it start?" (Minutes, hours or days)      Wednesday 4. PATTERN "Does it come and go, or has it been constant since it started?"  "Does it get worse with exertion?"   "Are you feeling it now?"     constant 6. HEART RATE: "Can you tell me your heart rate?" "How many beats in 15 seconds?"  (Note: not all patients can do this)       104 7. RECURRENT SYMPTOM: "Have you ever had this before?" If so, ask: "When was the last time?" and "What happened that time?"      About 3 weeks ago, up to 140-100. Took diazepam at home and that helped. 8. CAUSE: "What do you think is causing the palpitations?"     Anxiety? 9. CARDIAC HISTORY: "Do you have any history of heart disease?" (e.g., heart attack, angina, bypass surgery, angioplasty, arrhythmia)      no 10. OTHER SYMPTOMS: "Do you have any other symptoms?" (e.g., dizziness, chest pain, sweating, difficulty breathing)       Unfocused, tingling that goes down in arm and leg, feels hot (no fever) 11. PREGNANCY: "Is there any chance you are pregnant?" "When was your last menstrual period?"       No, LMP 7 days ago  Protocols used: HEART RATE AND HEARTBEAT QUESTIONS-A-AH

## 2018-01-04 LAB — MAGNESIUM: MAGNESIUM: 2 mg/dL (ref 1.5–2.5)

## 2018-01-04 LAB — BASIC METABOLIC PANEL
BUN / CREAT RATIO: 8 (calc) (ref 6–22)
BUN: 5 mg/dL — ABNORMAL LOW (ref 7–25)
CO2: 27 mmol/L (ref 20–32)
Calcium: 9.5 mg/dL (ref 8.6–10.2)
Chloride: 103 mmol/L (ref 98–110)
Creat: 0.62 mg/dL (ref 0.50–1.10)
GLUCOSE: 104 mg/dL (ref 65–139)
Potassium: 3.6 mmol/L (ref 3.5–5.3)
Sodium: 140 mmol/L (ref 135–146)

## 2018-01-04 LAB — T4, FREE: Free T4: 1.1 ng/dL (ref 0.8–1.8)

## 2018-01-04 LAB — T3, FREE: T3 FREE: 2.9 pg/mL (ref 2.3–4.2)

## 2018-01-06 ENCOUNTER — Encounter: Payer: Self-pay | Admitting: Family Medicine

## 2018-01-06 DIAGNOSIS — R000 Tachycardia, unspecified: Secondary | ICD-10-CM | POA: Diagnosis not present

## 2018-01-07 ENCOUNTER — Telehealth: Payer: Self-pay

## 2018-01-07 DIAGNOSIS — G522 Disorders of vagus nerve: Secondary | ICD-10-CM | POA: Diagnosis not present

## 2018-01-07 DIAGNOSIS — M530 Cervicocranial syndrome: Secondary | ICD-10-CM | POA: Diagnosis not present

## 2018-01-07 DIAGNOSIS — M531 Cervicobrachial syndrome: Secondary | ICD-10-CM | POA: Diagnosis not present

## 2018-01-07 NOTE — Telephone Encounter (Signed)
Copied from CRM [id number]. Topic: Referral - Status >> Jan 06, 2018  4:20 PM Alexander Bergeron B wrote: Reason for CRM: pt called to check the status of referral to see what new facility is handling the referral  I called to inform this patient that she has been scheduled to see Purnell Shoemaker, NP with [facility], tomorrow (01/08/18), but that she needed to call them at [phone] to confirm that she will be able to make it or reschedule. I also left my direct line so that she could call me back if she had any questions.

## 2018-01-08 DIAGNOSIS — R42 Dizziness and giddiness: Secondary | ICD-10-CM | POA: Diagnosis not present

## 2018-01-08 DIAGNOSIS — R002 Palpitations: Secondary | ICD-10-CM | POA: Diagnosis not present

## 2018-01-08 NOTE — Telephone Encounter (Signed)
Veronica Jenkins, please get patient in to see another cardiologist sooner than June Find out if THEY will order a Holter monitor and read; in the past, I have ordered Holter monitors and do not get results back in a timely fashion, so I'd like to defer to them if they will do that When done, please respond to patient through MyChart

## 2018-01-10 ENCOUNTER — Ambulatory Visit (INDEPENDENT_AMBULATORY_CARE_PROVIDER_SITE_OTHER): Payer: BLUE CROSS/BLUE SHIELD | Admitting: Family Medicine

## 2018-01-10 ENCOUNTER — Encounter: Payer: Self-pay | Admitting: Family Medicine

## 2018-01-10 VITALS — BP 124/82 | HR 91 | Temp 98.4°F | Resp 14 | Ht 62.0 in | Wt 142.0 lb

## 2018-01-10 DIAGNOSIS — F418 Other specified anxiety disorders: Secondary | ICD-10-CM

## 2018-01-10 DIAGNOSIS — M530 Cervicocranial syndrome: Secondary | ICD-10-CM | POA: Diagnosis not present

## 2018-01-10 DIAGNOSIS — M531 Cervicobrachial syndrome: Secondary | ICD-10-CM | POA: Diagnosis not present

## 2018-01-10 DIAGNOSIS — G522 Disorders of vagus nerve: Secondary | ICD-10-CM | POA: Diagnosis not present

## 2018-01-10 DIAGNOSIS — R000 Tachycardia, unspecified: Secondary | ICD-10-CM | POA: Diagnosis not present

## 2018-01-10 NOTE — Progress Notes (Signed)
BP 124/82   Pulse 91   Temp 98.4 F (36.9 C) (Oral)   Resp 14   Ht  (1.575 m)   Wt 142 lb (64.4 kg)   LMP 12/27/2017 (Approximate)   SpO2 95%   BMI 25.97 kg/m    Subjective:    Patient ID: Veronica Jenkins, female    DOB: 04-04-1981, 37 y.o.   MRN: [id number]  HPI: Veronica Jenkins is a 37 y.o. female  Chief Complaint  Patient presents with  . Follow-up    HPI Patient is here for follow-up She saw the cardiologist She did the 48 hour holter monitor and dropped that off this morning She did not capture anything during monitoring; auto-capture They ordered an echo; NP cardiology did not hear anything worrisome The beta-blocker did in fact slow the heart rate down, everything was chill Got to work yesterday and felt jittery and anxious and took the pill, even though NP talked about stopping it Pretty soon, will have light at the end of the tunnel soon, a few weeks school will be out She did have some nausea and loose stool She saw a provider who practices [facility], chiropractor; she did the treatment and felt much better the next day She woke up the next day feeling great Talked about the parasympathetic nervous system and the vagus nerve Appointment with counselor next week She started the lexapro the day before the beta-blocker; she would be willing to continue them both for a month She is willing to stop them in the future and see what happens; will wait until at least after holter results and echo are back Taking them in the morning  Depression screen [facility] 2/9 01/03/2018 06/19/2017 04/26/2017 01/10/2017 09/03/2016  Decreased Interest 0 0 0 0 0  Down, Depressed, Hopeless 0 0 0 0 0  PHQ - 2 Score 0 0 0 0 0    Relevant past medical, surgical, family and social history reviewed Past Medical History:  Diagnosis Date  . Anxiety    Past Surgical History:  Procedure Laterality Date  . CESAREAN SECTION     Family History  Problem Relation Age of Onset  .  Congestive Heart Failure Maternal Grandfather   . Heart disease Maternal Grandfather   . Diabetes Neg Hx   . Cancer Neg Hx   . Hypertension Neg Hx   . Stroke Neg Hx   . COPD Neg Hx    Social History   Tobacco Use  . Smoking status: Former Smoker    Packs/day: 0.50    Years: 10.00    Pack years: 5.00    Types: Cigarettes    Last attempt to quit: 06/07/2008    Years since quitting: 9.6  . Smokeless tobacco: Never Used  Substance Use Topics  . Alcohol use: Yes    Comment: rare  . Drug use: No    Interim medical history since last visit reviewed. Allergies and medications reviewed  Review of Systems Per HPI unless specifically indicated above     Objective:    BP 124/82   Pulse 91   Temp 98.4 F (36.9 C) (Oral)   Resp 14   Ht  (1.575 m)   Wt 142 lb (64.4 kg)   LMP 12/27/2017 (Approximate)   SpO2 95%   BMI 25.97 kg/m   Wt Readings from Last 3 Encounters:  01/10/18 142 lb (64.4 kg)  01/03/18 143 lb 11.2 oz (65.2 kg)  01/02/18 145 lb (65.8 kg)  Physical Exam  Constitutional: She appears well-developed and well-nourished.  HENT:  Mouth/Throat: Mucous membranes are normal.  Eyes: EOM are normal. No scleral icterus.  Cardiovascular: Normal rate and regular rhythm.  Pulmonary/Chest: Effort normal and breath sounds normal.  Psychiatric: She has a normal mood and affect. Her behavior is normal.      Assessment & Plan:   Problem List Items Addressed This Visit      Other   Anxiety about health    Having the palpitations worked up; seeing cardiologist; patient appears less anxious today       Other Visit Diagnoses    Tachycardia    -  Primary   improved with beta-blocker; work-up thus far for thyroid, etc all negative; continue to see cardiologist       Follow up plan: Return in about 1 month (around 02/07/2018) for follow-up visit with Dr. Sherie Don.  An after-visit summary was printed and given to the patient at check-out.  Please see the patient  instructions which may contain other information and recommendations beyond what is mentioned above in the assessment and plan.  No orders of the defined types were placed in this encounter.   No orders of the defined types were placed in this encounter.

## 2018-01-10 NOTE — Patient Instructions (Signed)
Okay to move lexapro to bedtime Keep follow-up with cardiology

## 2018-01-15 DIAGNOSIS — R002 Palpitations: Secondary | ICD-10-CM | POA: Diagnosis not present

## 2018-01-16 DIAGNOSIS — F4322 Adjustment disorder with anxiety: Secondary | ICD-10-CM | POA: Diagnosis not present

## 2018-01-16 LAB — CATECHOLAMINES, FRAC W/VMA, 24H UR
Calc Total (E+NE): 40 mcg/24 h (ref 26–121)
Creatinine, 24H, Ur: 1.14 g/(24.h) (ref 0.50–2.15)
Creatinine, Urine: 1.14 g/(24.h) (ref 0.50–2.15)
Dopamine: 85 mcg/24 h (ref 52–480)
Epinephrine, 24H, Ur: 9 mcg/24 h (ref 2–24)
NOREPINEPHRINE, 24H, UR: 31 ug/(24.h) (ref 15–100)
Vanillylmandelic Acid: <3 mg/24 h
Volume, Urine-PORPF: 3000 mL
Volume, Urine-PORPF: 3000 mL

## 2018-01-16 LAB — METANEPHRINES, URINE, 24 HOUR
Metaneph Total, Ur: 254 mcg/24 h (ref 115–695)
Metanephrines, Ur: 90 mcg/24 h (ref 36–190)
Normetanephrine, 24H Ur: 164 mcg/24 h (ref 35–482)
Volume, Urine-VMAUR: 3000 mL

## 2018-01-20 NOTE — Assessment & Plan Note (Signed)
Discussed current symptoms, anxiety in regards to her health; see AVS for tips on dealing with anxiety

## 2018-01-21 NOTE — Assessment & Plan Note (Signed)
Having the palpitations worked up; seeing cardiologist; patient appears less anxious today

## 2018-01-23 ENCOUNTER — Other Ambulatory Visit: Payer: Self-pay | Admitting: Family Medicine

## 2018-01-23 DIAGNOSIS — F418 Other specified anxiety disorders: Secondary | ICD-10-CM

## 2018-01-25 ENCOUNTER — Other Ambulatory Visit: Payer: Self-pay | Admitting: Family Medicine

## 2018-01-25 DIAGNOSIS — R000 Tachycardia, unspecified: Secondary | ICD-10-CM

## 2018-01-29 DIAGNOSIS — R002 Palpitations: Secondary | ICD-10-CM | POA: Diagnosis not present

## 2018-01-30 ENCOUNTER — Other Ambulatory Visit: Payer: Self-pay | Admitting: Family Medicine

## 2018-01-30 DIAGNOSIS — F418 Other specified anxiety disorders: Secondary | ICD-10-CM

## 2018-01-30 MED ORDER — ESCITALOPRAM OXALATE 10 MG PO TABS: 10.0000 mg | ORAL_TABLET | Freq: Every day | ORAL | 1 refills | Status: DC

## 2018-01-30 NOTE — Telephone Encounter (Signed)
Copied from CRM [id number]. Topic: Quick Communication - Rx Refill/Question >> Jan 30, 2018  1:49 PM Arlyss Gandyichardson, Lurleen Soltero N, NT wrote: Medication: escitalopram (LEXAPRO) 10 MG tablet   Has the patient contacted their pharmacy? Yes.   (Agent: If no, request that the patient contact the pharmacy for the refill.) (Agent: If yes, when and what did the pharmacy advise?)  Preferred Pharmacy (with phone number or street name): [id number] - [place], [place] - [address] [phone] (Phone) [phone] (Fax)      Agent: Please be advised that RX refills may take up to 3 business days. We ask that you follow-up with your pharmacy.

## 2018-02-05 ENCOUNTER — Encounter: Payer: Self-pay | Admitting: Family Medicine

## 2018-02-05 ENCOUNTER — Ambulatory Visit (INDEPENDENT_AMBULATORY_CARE_PROVIDER_SITE_OTHER): Payer: BLUE CROSS/BLUE SHIELD | Admitting: Family Medicine

## 2018-02-05 DIAGNOSIS — I340 Nonrheumatic mitral (valve) insufficiency: Secondary | ICD-10-CM

## 2018-02-05 DIAGNOSIS — F418 Other specified anxiety disorders: Secondary | ICD-10-CM

## 2018-02-05 DIAGNOSIS — I491 Atrial premature depolarization: Secondary | ICD-10-CM

## 2018-02-05 MED ORDER — ESCITALOPRAM OXALATE 10 MG PO TABS: 5.0000 mg | ORAL_TABLET | Freq: Every day | ORAL | 5 refills | Status: DC

## 2018-02-05 NOTE — Patient Instructions (Addendum)
Let's drop the dose of the Lexapro (escitalopram) to 5 mg daily Use caution with anything that might interfere with its clearance Practice mindfulness, stress-reduction

## 2018-02-05 NOTE — Assessment & Plan Note (Signed)
Continue Lexapro for 3-6 months; decrease dose to 5 mg to see if still effective with fewer side effects; be careful about CBD oil, prevents metabolism of SSRI

## 2018-02-05 NOTE — Assessment & Plan Note (Signed)
Noted on echo; discussed; asx

## 2018-02-05 NOTE — Assessment & Plan Note (Signed)
Discovered on Holter monitor; may continue stress reduction, PRN beta-blocker; reviewed cardiologist note and echo findings; no need to f/u with him unless needed in future

## 2018-02-05 NOTE — Progress Notes (Signed)
BP 108/72   Pulse 95   Temp 98.2 F (36.8 C) (Oral)   Resp 12   Ht 5\' 2"  (1.575 m)   Wt 144 lb 14.4 oz (65.7 kg)   LMP 01/20/2018   SpO2 96%   BMI 26.50 kg/m    Subjective:    Patient ID: Veronica Jenkins, female    DOB: 09-18-1980, 37 y.o.   MRN: [id number]  HPI: Veronica Jenkins is a 37 y.o. female  Chief Complaint  Patient presents with  . Medication Refill  . Follow-up    HPI She is here for f/u; saw cardiologist, Dr. Philemon Kingdom note reviewed 01/29/18 They talked about the metoprolol; no medical reason to need it, but can use PRN Sometimes just happens, but for the most part can tell if something is stressful Cardiologist said okay to use PRN  Echo: INTERPRETATION NORMAL LEFT VENTRICULAR SYSTOLIC FUNCTION NORMAL RIGHT VENTRICULAR SYSTOLIC FUNCTION MILD VALVULAR REGURGITATION (See above) NO VALVULAR STENOSIS MILD MR, TR EF 50%  We talked about her lexapro; taking now for 4-5 weeks; feels better, not in the same place she was; "it's helping" Stress level is much better too from where it was a month ago Definitely thinks the lexapro is helping; headaches in the evening, some are bad, sometimes tylenol or advil; taking lexapro at 8 or 9 pm; some restless before going to sleep, some tossing and turning; she attributes to the lexapro; I offered   Asked about length of therapy; doing counseling; goes again tomorrow; doing mindfulness    Office Visit from 02/05/2018 in [facility]  AUDIT-C Score  1      Depression screen [facility] 2/9 02/05/2018 01/03/2018 06/19/2017 04/26/2017 01/10/2017  Decreased Interest 0 0 0 0 0  Down, Depressed, Hopeless 0 0 0 0 0  PHQ - 2 Score 0 0 0 0 0  Altered sleeping 0 - - - -  Tired, decreased energy 3 - - - -  Change in appetite 0 - - - -  Feeling bad or failure about yourself  0 - - - -  Trouble concentrating 0 - - - -  Moving slowly or fidgety/restless 0 - - - -  Suicidal thoughts 0 - - - -  PHQ-9 Score 3 - - - -    Difficult doing work/chores Not difficult at all - - - -    Relevant past medical, surgical, family and social history reviewed Past Medical History:  Diagnosis Date  . Anxiety    Past Surgical History:  Procedure Laterality Date  . CESAREAN SECTION     Family History  Problem Relation Age of Onset  . Congestive Heart Failure Maternal Grandfather   . Heart disease Maternal Grandfather   . Diabetes Neg Hx   . Cancer Neg Hx   . Hypertension Neg Hx   . Stroke Neg Hx   . COPD Neg Hx    Social History   Tobacco Use  . Smoking status: Former Smoker    Packs/day: 0.50    Years: 10.00    Pack years: 5.00    Types: Cigarettes    Last attempt to quit: 06/07/2008    Years since quitting: 9.6  . Smokeless tobacco: Never Used  Substance Use Topics  . Alcohol use: Yes    Comment: rare  . Drug use: No    Interim medical history since last visit reviewed. Allergies and medications reviewed  Review of Systems Per HPI unless specifically indicated above  Objective:    BP 108/72   Pulse 95   Temp 98.2 F (36.8 C) (Oral)   Resp 12   Ht 5\' 2"  (1.575 m)   Wt 144 lb 14.4 oz (65.7 kg)   LMP 01/20/2018   SpO2 96%   BMI 26.50 kg/m   Wt Readings from Last 3 Encounters:  02/05/18 144 lb 14.4 oz (65.7 kg)  01/10/18 142 lb (64.4 kg)  01/03/18 143 lb 11.2 oz (65.2 kg)    Physical Exam  Constitutional: She appears well-developed and well-nourished.  HENT:  Mouth/Throat: Mucous membranes are normal.  Eyes: EOM are normal. No scleral icterus.  Cardiovascular: Normal rate, regular rhythm and normal heart sounds.  No extrasystoles are present.  No murmur heard. Pulmonary/Chest: Effort normal and breath sounds normal.  Psychiatric: She has a normal mood and affect. Her behavior is normal. Her mood appears not anxious. She does not exhibit a depressed mood.    Results for orders placed or performed in visit on 01/03/18  T4, free  Result Value Ref Range   Free T4 1.1 0.8 -  1.8 ng/dL  T3, free  Result Value Ref Range   T3, Free 2.9 2.3 - 4.2 pg/mL  Magnesium  Result Value Ref Range   Magnesium 2.0 1.5 - 2.5 mg/dL  Basic Metabolic Panel (BMET)  Result Value Ref Range   Glucose, Bld 104 65 - 139 mg/dL   BUN 5 (L) 7 - 25 mg/dL   Creat 4.09 8.11 - 9.14 mg/dL   BUN/Creatinine Ratio 8 6 - 22 (calc)   Sodium 140 135 - 146 mmol/L   Potassium 3.6 3.5 - 5.3 mmol/L   Chloride 103 98 - 110 mmol/L   CO2 27 20 - 32 mmol/L   Calcium 9.5 8.6 - 10.2 mg/dL  Metanephrines, Urine, 24 hour  Result Value Ref Range   Volume, Urine-VMAUR 3,000 mL   Metanephrines, Ur 90 36 - 190 mcg/24 h   Normetanephrine, 24H Ur 164 35 - 482 mcg/24 h   Metaneph Total, Ur 254 115 - 695 mcg/24 h  Catecholamines, Frac w/VMA, 24H Ur  Result Value Ref Range   Volume, Urine-PORPF 3,000 mL   Epinephrine, 24H, Ur 9 2 - 24 mcg/24 h   Norepinephrine, 24H, Ur 31 15 - 100 mcg/24 h   Calc Total (E+NE) 40 26 - 121 mcg/24 h   Dopamine 85 52 - 480 mcg/24 h   Creatinine, Urine 1.14 0.50 - 2.15 g/24 h   Volume, Urine-PORPF 3,000 mL   Vanillylmandelic Acid <3.0 6 OR LESS mg/24 h   Creatinine, 24H, Ur 1.14 0.50 - 2.15 g/24 h      Assessment & Plan:   Problem List Items Addressed This Visit      Cardiovascular and Mediastinum   PAC (premature atrial contraction)    Discovered on Holter monitor; may continue stress reduction, PRN beta-blocker; reviewed cardiologist note and echo findings; no need to f/u with him unless needed in future      Mitral regurgitation    Noted on echo; discussed; asx        Other   Anxiety about health    Continue Lexapro for 3-6 months; decrease dose to 5 mg to see if still effective with fewer side effects; be careful about CBD oil, prevents metabolism of SSRI      Relevant Medications   escitalopram (LEXAPRO) 10 MG tablet       Follow up plan: Return in about 3 months (around 05/08/2018)  for follow-up visit with Dr. Sherie DonLada.  An after-visit summary was  printed and given to the patient at check-out.  Please see the patient instructions which may contain other information and recommendations beyond what is mentioned above in the assessment and plan.  Meds ordered this encounter  Medications  . escitalopram (LEXAPRO) 10 MG tablet    Sig: Take 0.5 tablets (5 mg total) by mouth daily.    Dispense:  15 tablet    Refill:  5    No orders of the defined types were placed in this encounter.

## 2018-02-13 DIAGNOSIS — M531 Cervicobrachial syndrome: Secondary | ICD-10-CM | POA: Diagnosis not present

## 2018-02-13 DIAGNOSIS — G522 Disorders of vagus nerve: Secondary | ICD-10-CM | POA: Diagnosis not present

## 2018-02-13 DIAGNOSIS — M530 Cervicocranial syndrome: Secondary | ICD-10-CM | POA: Diagnosis not present

## 2018-02-17 ENCOUNTER — Other Ambulatory Visit: Payer: Self-pay | Admitting: Obstetrics and Gynecology

## 2018-02-26 ENCOUNTER — Other Ambulatory Visit: Payer: Self-pay | Admitting: Family Medicine

## 2018-02-26 DIAGNOSIS — R000 Tachycardia, unspecified: Secondary | ICD-10-CM

## 2018-03-01 ENCOUNTER — Ambulatory Visit
Admission: EM | Admit: 2018-03-01 | Discharge: 2018-03-01 | Disposition: A | Discharge status: Home / Self Care | Payer: BLUE CROSS/BLUE SHIELD | Attending: Family Medicine | Admitting: Family Medicine

## 2018-03-01 ENCOUNTER — Other Ambulatory Visit: Payer: Self-pay

## 2018-03-01 DIAGNOSIS — R300 Dysuria: Secondary | ICD-10-CM | POA: Diagnosis not present

## 2018-03-01 LAB — URINALYSIS, COMPLETE (UACMP) WITH MICROSCOPIC
BACTERIA UA: NONE SEEN
BILIRUBIN URINE: NEGATIVE
Glucose, UA: NEGATIVE mg/dL
Hgb urine dipstick: NEGATIVE
KETONES UR: NEGATIVE mg/dL
LEUKOCYTES UA: NEGATIVE
Nitrite: NEGATIVE
PH: 5.5 (ref 5.0–8.0)
PROTEIN: NEGATIVE mg/dL
RBC / HPF: NONE SEEN RBC/hpf (ref 0–5)
Specific Gravity, Urine: <1.005 — ABNORMAL LOW (ref 1.005–1.030)

## 2018-03-01 LAB — WET PREP, GENITAL
CLUE CELLS WET PREP: NONE SEEN
Sperm: NONE SEEN
TRICH WET PREP: NONE SEEN
YEAST WET PREP: NONE SEEN

## 2018-03-01 NOTE — Discharge Instructions (Signed)
Rest. Drink plenty of fluids. Continue to monitor.   Follow up with your primary care physician this week as needed. Return to Urgent care for new or worsening concerns.

## 2018-03-01 NOTE — ED Provider Notes (Signed)
[facility] ____________________________________________  Time seen: Approximately 1200  PM  I have reviewed the triage vital signs and the nursing notes.   HISTORY  Chief Complaint Urinary Urgency   HPI Veronica Jenkins is a 37 y.o. female presenting for evaluation of odorous urine that she has noted for the last 1 week.  Reports occasional urinary frequency, but states that this varies.  Denies vaginal discharge, vaginal pain, abdominal pain or fevers.  Reports does have some low back pain across the entire low back that was present only upon this morning.  Denies fall or trauma.  No flank pain.  States that she recently started taking probiotics to help with overall GI regularity and bowel movements, and reports that this works well, states that she was not sure if this could be causing some of the odors changes versus infection and wanted to make sure she did not have a urinary infection.  Reports otherwise feels well.  Denies other over-the-counter medications taken for the same complaints.  Denies other aggravating or alleviating factors. Denies recent sickness. Denies recent antibiotic use.   Kerman Passey, MD: PCP Patient's last menstrual period was 02/15/2018.Denies pregnancy.    Past Medical History:  Diagnosis Date  . Anxiety     Patient Active Problem List   Diagnosis Date Noted  . PAC (premature atrial contraction) 02/05/2018  . Mitral regurgitation 02/05/2018  . Preventative health care 06/19/2017  . Anxiety about health 05/06/2017  . Right hip pain 05/06/2017  . Adolescent idiopathic scoliosis of lumbar region 09/08/2015  . Overweight (BMI 25.0-29.9) 06/08/2015    Past Surgical History:  Procedure Laterality Date  . CESAREAN SECTION       No current facility-administered medications for this encounter.   Current Outpatient Medications:  .  escitalopram (LEXAPRO) 10 MG tablet, Take 0.5 tablets (5 mg total) by mouth daily., Disp: 15 tablet,  Rfl: 5  Allergies Morphine  Family History  Problem Relation Age of Onset  . Congestive Heart Failure Maternal Grandfather   . Heart disease Maternal Grandfather   . Diabetes Neg Hx   . Cancer Neg Hx   . Hypertension Neg Hx   . Stroke Neg Hx   . COPD Neg Hx     Social History Social History   Tobacco Use  . Smoking status: Former Smoker    Packs/day: 0.50    Years: 10.00    Pack years: 5.00    Types: Cigarettes    Last attempt to quit: 06/07/2008    Years since quitting: 9.7  . Smokeless tobacco: Never Used  Substance Use Topics  . Alcohol use: Yes    Comment: rare  . Drug use: No    Review of Systems Constitutional: No fever/chills Cardiovascular: Denies chest pain. Respiratory: Denies shortness of breath. Gastrointestinal: No abdominal pain.  No nausea, no vomiting.  No diarrhea.  No constipation. Genitourinary: As above.  Musculoskeletal:As above.  Skin: Negative for rash.  ____________________________________________   PHYSICAL EXAM:  VITAL SIGNS: ED Triage Vitals  Enc Vitals Group     BP 03/01/18 1112 130/90     Pulse Rate 03/01/18 1112 94     Resp 03/01/18 1112 18     Temp 03/01/18 1112 98 F (36.7 C)     Temp Source 03/01/18 1112 Oral     SpO2 03/01/18 1112 99 %     Weight 03/01/18 1110 145 lb (65.8 kg)     Height 03/01/18 1110 5\' 2"  (1.575 m)  Head Circumference --      Peak Flow --      Pain Score 03/01/18 1110 2     Pain Loc --      Pain Edu? --      Excl. in GC? --     Constitutional: Alert and oriented. Well appearing and in no acute distress. ENT      Head: Normocephalic and atraumatic. Cardiovascular: Normal rate, regular rhythm. Grossly normal heart sounds.  Good peripheral circulation. Respiratory: Normal respiratory effort without tachypnea nor retractions. Breath sounds are clear and equal bilaterally. No wheezes, rales, rhonchi. Gastrointestinal: Soft and nontender. No CVA tenderness. Musculoskeletal:  No midline  cervical, thoracic or lumbar tenderness to palpation.  Changes positions quickly in room.  Steady gait. Neurologic:  Normal speech and language. Speech is normal. No gait instability.  Skin:  Skin is warm, dry and intact. No rash noted. Psychiatric: Mood and affect are normal. Speech and behavior are normal. Patient exhibits appropriate insight and judgment   ___________________________________________   LABS (all labs ordered are listed, but only abnormal results are displayed)  Labs Reviewed  WET PREP, GENITAL - Abnormal; Notable for the following components:      Result Value   WBC, Wet Prep HPF POC FEW (*)    All other components within normal limits  URINALYSIS, COMPLETE (UACMP) WITH MICROSCOPIC - Abnormal; Notable for the following components:   Color, Urine STRAW (*)    Specific Gravity, Urine <1.005 (*)    All other components within normal limits  URINE CULTURE   ____________________________________________   PROCEDURES Procedures    INITIAL IMPRESSION / ASSESSMENT AND PLAN / ED COURSE  Pertinent labs & imaging results that were available during my care of the patient were reviewed by me and considered in my medical decision making (see chart for details).  Well-appearing patient.  No acute distress.  Urinalysis reviewed, discussed with patient, no clear UTI.  Encourage evaluation of wet prep, patient declined pelvic exam and elected for self wet prep which was unremarkable.  Also discussed other causes of urinary changes including diet, fluids, as well as contacts.  Patient denies any other known trigger except for change of probiotics.  Encourage supportive care and close monitoring.  Discussed follow up with Primary care physician this week. Discussed follow up and return parameters including no resolution or any worsening concerns. Patient verbalized understanding and agreed to plan.   ____________________________________________   FINAL CLINICAL IMPRESSION(S) /  ED DIAGNOSES  Final diagnoses:  Dysuria     ED Discharge Orders    None       Note: This dictation was prepared with Dragon dictation along with smaller phrase technology. Any transcriptional errors that result from this process are unintentional.         Renford DillsMiller, Carney Saxton, NP 03/01/18 1319

## 2018-03-01 NOTE — ED Triage Notes (Signed)
Patient complains of urinary urgency and bladder pressure that started yesterday. Patient states that urine has been having an odor 1 week ago.

## 2018-03-04 ENCOUNTER — Telehealth (HOSPITAL_COMMUNITY): Payer: Self-pay

## 2018-03-04 DIAGNOSIS — M531 Cervicobrachial syndrome: Secondary | ICD-10-CM | POA: Diagnosis not present

## 2018-03-04 DIAGNOSIS — G522 Disorders of vagus nerve: Secondary | ICD-10-CM | POA: Diagnosis not present

## 2018-03-04 DIAGNOSIS — M530 Cervicocranial syndrome: Secondary | ICD-10-CM | POA: Diagnosis not present

## 2018-03-04 LAB — URINE CULTURE: Culture: 50000 — AB

## 2018-03-04 MED ORDER — NITROFURANTOIN MONOHYD MACRO 100 MG PO CAPS: 100.0000 mg | ORAL_CAPSULE | Freq: Two times a day (BID) | ORAL | 0 refills | Status: DC

## 2018-03-04 NOTE — Telephone Encounter (Signed)
Urine culture positive for E.Coli. This was not treated at urgent care. rx for Macrobid 100 mg BID x 5 days sent to pharmacy of choice.

## 2018-03-06 ENCOUNTER — Telehealth (HOSPITAL_COMMUNITY): Payer: Self-pay

## 2018-03-06 NOTE — Telephone Encounter (Signed)
Attempted to reach patient x 2 

## 2018-03-10 ENCOUNTER — Telehealth (HOSPITAL_COMMUNITY): Payer: Self-pay

## 2018-03-10 NOTE — Telephone Encounter (Signed)
Pt called and aware of positive results and new rx

## 2018-03-18 DIAGNOSIS — M531 Cervicobrachial syndrome: Secondary | ICD-10-CM | POA: Diagnosis not present

## 2018-03-18 DIAGNOSIS — G522 Disorders of vagus nerve: Secondary | ICD-10-CM | POA: Diagnosis not present

## 2018-03-18 DIAGNOSIS — M530 Cervicocranial syndrome: Secondary | ICD-10-CM | POA: Diagnosis not present

## 2018-04-01 DIAGNOSIS — M530 Cervicocranial syndrome: Secondary | ICD-10-CM | POA: Diagnosis not present

## 2018-04-01 DIAGNOSIS — M531 Cervicobrachial syndrome: Secondary | ICD-10-CM | POA: Diagnosis not present

## 2018-04-01 DIAGNOSIS — G522 Disorders of vagus nerve: Secondary | ICD-10-CM | POA: Diagnosis not present

## 2018-04-03 DIAGNOSIS — F4322 Adjustment disorder with anxiety: Secondary | ICD-10-CM | POA: Diagnosis not present

## 2018-04-16 DIAGNOSIS — G522 Disorders of vagus nerve: Secondary | ICD-10-CM | POA: Diagnosis not present

## 2018-04-16 DIAGNOSIS — M531 Cervicobrachial syndrome: Secondary | ICD-10-CM | POA: Diagnosis not present

## 2018-04-16 DIAGNOSIS — M530 Cervicocranial syndrome: Secondary | ICD-10-CM | POA: Diagnosis not present

## 2018-05-08 ENCOUNTER — Encounter: Payer: Self-pay | Admitting: Family Medicine

## 2018-05-08 ENCOUNTER — Ambulatory Visit (INDEPENDENT_AMBULATORY_CARE_PROVIDER_SITE_OTHER): Payer: BLUE CROSS/BLUE SHIELD | Admitting: Family Medicine

## 2018-05-08 VITALS — BP 120/76 | HR 98 | Temp 98.2°F | Ht 62.0 in | Wt 148.4 lb

## 2018-05-08 DIAGNOSIS — F418 Other specified anxiety disorders: Secondary | ICD-10-CM

## 2018-05-08 DIAGNOSIS — F329 Major depressive disorder, single episode, unspecified: Secondary | ICD-10-CM | POA: Insufficient documentation

## 2018-05-08 DIAGNOSIS — Z23 Encounter for immunization: Secondary | ICD-10-CM | POA: Diagnosis not present

## 2018-05-08 DIAGNOSIS — F411 Generalized anxiety disorder: Secondary | ICD-10-CM | POA: Diagnosis not present

## 2018-05-08 DIAGNOSIS — F32A Depression, unspecified: Secondary | ICD-10-CM | POA: Insufficient documentation

## 2018-05-08 MED ORDER — ESCITALOPRAM OXALATE 10 MG PO TABS: 10.0000 mg | ORAL_TABLET | Freq: Every day | ORAL | 3 refills | Status: DC

## 2018-05-08 NOTE — Patient Instructions (Signed)
Let's continue your medicine Call if you'd like to try weaning down or switching to Trintellix

## 2018-05-08 NOTE — Progress Notes (Signed)
BP 120/76   Pulse 98   Temp 98.2 F (36.8 C) (Oral)   Ht 5\' 2"  (1.575 m)   Wt 148 lb 6.4 oz (67.3 kg)   LMP 05/08/2018   SpO2 98%   BMI 27.14 kg/m    Subjective:    Patient ID: Veronica Jenkins, female    DOB: 1981-06-27, 37 y.o.   MRN: [id number]  HPI: Veronica Jenkins is a 37 y.o. female  Chief Complaint  Patient presents with  . Follow-up    HPI Patient is here for f/u 3 month follow-up  Mood is doing "good" She had dropped down to the 0.5 mg and started to feel anxious Then after a month, she went to the 10 mg  She does not want to feel the way she was feeling back then Her minid is a lot more calm, not wandering She can tell on days when she is a little anxiety-ridden; nothing like it was before It has been a year since it was rock bottom with anxiety and crying Such a rock and a hard place Decreased libido, but willing to have happy wife Flu shot today Sees Melody for her GYN care  Depression screen [facility] 2/9 05/08/2018 02/05/2018 01/03/2018 06/19/2017 04/26/2017  Decreased Interest 0 0 0 0 0  Down, Depressed, Hopeless 0 0 0 0 0  PHQ - 2 Score 0 0 0 0 0  Altered sleeping 0 0 - - -  Tired, decreased energy 0 3 - - -  Change in appetite 0 0 - - -  Feeling bad or failure about yourself  0 0 - - -  Trouble concentrating 0 0 - - -  Moving slowly or fidgety/restless 0 0 - - -  Suicidal thoughts 0 0 - - -  PHQ-9 Score 0 3 - - -  Difficult doing work/chores Not difficult at all Not difficult at all - - -    Relevant past medical, surgical, family and social history reviewed Past Medical History:  Diagnosis Date  . Anxiety    Past Surgical History:  Procedure Laterality Date  . CESAREAN SECTION     Family History  Problem Relation Age of Onset  . Congestive Heart Failure Maternal Grandfather   . Heart disease Maternal Grandfather   . Diabetes Neg Hx   . Cancer Neg Hx   . Hypertension Neg Hx   . Stroke Neg Hx   . COPD Neg Hx    Social History   Tobacco  Use  . Smoking status: Former Smoker    Packs/day: 0.50    Years: 10.00    Pack years: 5.00    Types: Cigarettes    Last attempt to quit: 06/07/2008    Years since quitting: 9.9  . Smokeless tobacco: Never Used  Substance Use Topics  . Alcohol use: Yes    Comment: rare  . Drug use: No    Interim medical history since last visit reviewed. Allergies and medications reviewed  Review of Systems Per HPI unless specifically indicated above     Objective:    BP 120/76   Pulse 98   Temp 98.2 F (36.8 C) (Oral)   Ht 5\' 2"  (1.575 m)   Wt 148 lb 6.4 oz (67.3 kg)   LMP 05/08/2018   SpO2 98%   BMI 27.14 kg/m   Wt Readings from Last 3 Encounters:  05/08/18 148 lb 6.4 oz (67.3 kg)  03/01/18 145 lb (65.8 kg)  02/05/18 144 lb 14.4  oz (65.7 kg)    Physical Exam  Constitutional: She appears well-developed and well-nourished.  HENT:  Mouth/Throat: Mucous membranes are normal.  Eyes: EOM are normal. No scleral icterus.  Cardiovascular: Normal rate and regular rhythm.  Pulmonary/Chest: Effort normal and breath sounds normal.  Neurological: She displays no tremor.  Psychiatric: She has a normal mood and affect. Her behavior is normal.    Results for orders placed or performed during the hospital encounter of 03/01/18  Wet prep, genital  Result Value Ref Range   Yeast Wet Prep HPF POC NONE SEEN NONE SEEN   Trich, Wet Prep NONE SEEN NONE SEEN   Clue Cells Wet Prep HPF POC NONE SEEN NONE SEEN   WBC, Wet Prep HPF POC FEW (A) NONE SEEN   Sperm NONE SEEN   Urine Culture  Result Value Ref Range   Specimen Description      URINE, CLEAN CATCH Performed at [facility], [address] [place], [place] [address]    Special Requests      NONE Performed at [facility], [address] [place], [place] [address]    Culture 50,000 COLONIES/mL ESCHERICHIA COLI (A)    Report Status 03/04/2018 FINAL    Organism ID, Bacteria ESCHERICHIA COLI (A)        Susceptibility   Escherichia coli - MIC*    AMPICILLIN >=32 RESISTANT Resistant     CEFAZOLIN <=4 SENSITIVE Sensitive     CEFTRIAXONE <=1 SENSITIVE Sensitive     CIPROFLOXACIN <=0.25 SENSITIVE Sensitive     GENTAMICIN <=1 SENSITIVE Sensitive     IMIPENEM <=0.25 SENSITIVE Sensitive     NITROFURANTOIN <=16 SENSITIVE Sensitive     TRIMETH/SULFA >=320 RESISTANT Resistant     AMPICILLIN/SULBACTAM >=32 RESISTANT Resistant     PIP/TAZO <=4 SENSITIVE Sensitive     Extended ESBL NEGATIVE Sensitive     * 50,000 COLONIES/mL ESCHERICHIA COLI  Urinalysis, Complete w Microscopic  Result Value Ref Range   Color, Urine STRAW (A) YELLOW   APPearance CLEAR CLEAR   Specific Gravity, Urine <1.005 (L) 1.005 - 1.030   pH 5.5 5.0 - 8.0   Glucose, UA NEGATIVE NEGATIVE mg/dL   Hgb urine dipstick NEGATIVE NEGATIVE   Bilirubin Urine NEGATIVE NEGATIVE   Ketones, ur NEGATIVE NEGATIVE mg/dL   Protein, ur NEGATIVE NEGATIVE mg/dL   Nitrite NEGATIVE NEGATIVE   Leukocytes, UA NEGATIVE NEGATIVE   Squamous Epithelial / LPF 0-5 0 - 5   WBC, UA 0-5 0 - 5 WBC/hpf   RBC / HPF NONE SEEN 0 - 5 RBC/hpf   Bacteria, UA NONE SEEN NONE SEEN      Assessment & Plan:   Problem List Items Addressed This Visit      Other   Anxiety about health   Relevant Medications   escitalopram (LEXAPRO) 10 MG tablet   Generalized anxiety disorder - Primary    Continue SSRI for now, but she can call to switch agents or wean down if desired      Relevant Medications   escitalopram (LEXAPRO) 10 MG tablet   Depression    Continue the SSRI; offered to switch to Trintellix; she would prefer to stay on her current medicine despite the sexual side effects; call if she'd like to switch or wean at some point; o/w f/u in 6 months      Relevant Medications   escitalopram (LEXAPRO) 10 MG tablet    Other Visit Diagnoses    Need for influenza  vaccination       Relevant Orders   Flu Vaccine QUAD 6+ mos PF IM (Fluarix Quad PF) (Completed)         Follow up plan: Return in about 6 months (around 11/06/2018) for follow-up visit with Dr. Sherie DonLada.  An after-visit summary was printed and given to the patient at check-out.  Please see the patient instructions which may contain other information and recommendations beyond what is mentioned above in the assessment and plan.  Meds ordered this encounter  Medications  . escitalopram (LEXAPRO) 10 MG tablet    Sig: Take 1 tablet (10 mg total) by mouth daily.    Dispense:  90 tablet    Refill:  3    We're increasing the dose back to 10 mg    Orders Placed This Encounter  Procedures  . Flu Vaccine QUAD 6+ mos PF IM (Fluarix Quad PF)

## 2018-05-08 NOTE — Assessment & Plan Note (Signed)
Continue SSRI for now, but she can call to switch agents or wean down if desired

## 2018-05-08 NOTE — Assessment & Plan Note (Signed)
Continue the SSRI; offered to switch to Trintellix; she would prefer to stay on her current medicine despite the sexual side effects; call if she'd like to switch or wean at some point; o/w f/u in 6 months

## 2018-06-02 ENCOUNTER — Encounter: Payer: Self-pay | Admitting: Family Medicine

## 2018-06-03 MED ORDER — BUPROPION HCL ER (XL) 150 MG PO TB24: 150.0000 mg | ORAL_TABLET | Freq: Every day | ORAL | 0 refills | Status: DC

## 2018-06-04 ENCOUNTER — Encounter: Payer: Self-pay | Admitting: Emergency Medicine

## 2018-06-04 ENCOUNTER — Other Ambulatory Visit: Payer: Self-pay

## 2018-06-04 ENCOUNTER — Ambulatory Visit
Admission: EM | Admit: 2018-06-04 | Discharge: 2018-06-04 | Disposition: A | Discharge status: Home / Self Care | Payer: BLUE CROSS/BLUE SHIELD | Attending: Family Medicine | Admitting: Family Medicine

## 2018-06-04 DIAGNOSIS — J0100 Acute maxillary sinusitis, unspecified: Secondary | ICD-10-CM | POA: Diagnosis not present

## 2018-06-04 DIAGNOSIS — Z87891 Personal history of nicotine dependence: Secondary | ICD-10-CM

## 2018-06-04 MED ORDER — AMOXICILLIN-POT CLAVULANATE 875-125 MG PO TABS: 1.0000 | ORAL_TABLET | Freq: Two times a day (BID) | ORAL | 0 refills | Status: DC

## 2018-06-04 NOTE — ED Triage Notes (Signed)
Pt c/o post nasal drainage, cough, maxillary sinus pain, and right ear pain. Congestion started 3 weeks ago. Started having sinus pain 5 days ago.

## 2018-06-04 NOTE — Discharge Instructions (Addendum)
Take medication as prescribed. Rest. Drink plenty of fluids.  ° °Follow up with your primary care physician this week as needed. Return to Urgent care for new or worsening concerns.  ° °

## 2018-06-04 NOTE — ED Provider Notes (Signed)
[facility] ____________________________________________  Time seen: Approximately 2:46 PM  I have reviewed the triage vital signs and the nursing notes.   HISTORY  Chief Complaint Sinus Problem   HPI Veronica Jenkins is a 37 y.o. female presenting for evaluation of 3 weeks of runny nose, nasal congestion complaints.  Patient reports that initially had sore throat and cough, but reports that has since resolved.  States continues with thick green nasal drainage as well as over the last 5 days has developed pressure to left cheek area.  States also feels that the pressure goes towards her ear and she can feel the drainage down her throat and neck.  States feels very congested.  States using over-the-counter Mucinex and saline nasal rinses.  States does get a lot of thick drainage out on return.  Denies fevers.  Denies chest pain or shortness of breath.  Continues to overall eat and drink well.  States feels tired and feels over the congestion at this point.  Denies other aggravating alleviating factors.  Denies recent sickness or recent antibiotic use.  Reports otherwise doing well.  Kerman Passey, MD: PCP  Past Medical History:  Diagnosis Date  . Anxiety     Patient Active Problem List   Diagnosis Date Noted  . Generalized anxiety disorder 05/08/2018  . Depression 05/08/2018  . PAC (premature atrial contraction) 02/05/2018  . Mitral regurgitation 02/05/2018  . Preventative health care 06/19/2017  . Anxiety about health 05/06/2017  . Right hip pain 05/06/2017  . Adolescent idiopathic scoliosis of lumbar region 09/08/2015  . Overweight (BMI 25.0-29.9) 06/08/2015    Past Surgical History:  Procedure Laterality Date  . CESAREAN SECTION       No current facility-administered medications for this encounter.   Current Outpatient Medications:  .  escitalopram (LEXAPRO) 10 MG tablet, Take 10 mg by mouth daily., Disp: , Rfl:  .  amoxicillin-clavulanate (AUGMENTIN)  875-125 MG tablet, Take 1 tablet by mouth every 12 (twelve) hours., Disp: 20 tablet, Rfl: 0 .  buPROPion (WELLBUTRIN XL) 150 MG 24 hr tablet, Take 1 tablet (150 mg total) by mouth daily., Disp: 30 tablet, Rfl: 0  Allergies Morphine  Family History  Problem Relation Age of Onset  . Congestive Heart Failure Maternal Grandfather   . Heart disease Maternal Grandfather   . Diabetes Neg Hx   . Cancer Neg Hx   . Hypertension Neg Hx   . Stroke Neg Hx   . COPD Neg Hx     Social History Social History   Tobacco Use  . Smoking status: Former Smoker    Packs/day: 0.50    Years: 10.00    Pack years: 5.00    Types: Cigarettes    Last attempt to quit: 06/07/2008    Years since quitting: 9.9  . Smokeless tobacco: Never Used  Substance Use Topics  . Alcohol use: Yes    Comment: rare  . Drug use: No    Review of Systems Constitutional: No fever/chills ENT: No sore throat. As above.  Cardiovascular: Denies chest pain. Respiratory: Denies shortness of breath. Gastrointestinal: No abdominal pain.   Musculoskeletal: Negative for back pain. Skin: Negative for rash. N  ____________________________________________   PHYSICAL EXAM:  VITAL SIGNS: ED Triage Vitals  Enc Vitals Group     BP 06/04/18 1354 116/88     Pulse Rate 06/04/18 1354 76     Resp 06/04/18 1354 17     Temp 06/04/18 1354 98.2 F (36.8 C)  Temp Source 06/04/18 1354 Oral     SpO2 06/04/18 1354 99 %     Weight 06/04/18 1353 148 lb (67.1 kg)     Height 06/04/18 1353 5\' 2"  (1.575 m)     Head Circumference --      Peak Flow --      Pain Score 06/04/18 1352 5     Pain Loc --      Pain Edu? --      Excl. in GC? --     Constitutional: Alert and oriented. Well appearing and in no acute distress. Eyes: Conjunctivae are normal.  Head: Atraumatic.Mild to moderate tenderness to palpation left maxillary sinuses.  No other sinus tenderness palpation.  No swelling. No erythema.   Ears: no erythema, normal TMs  bilaterally.   Nose: nasal congestion with bilateral nasal turbinate erythema and edema.   Mouth/Throat: Mucous membranes are moist.  Oropharynx non-erythematous.No tonsillar swelling or exudate.  Neck: No stridor.  No cervical spine tenderness to palpation. Hematological/Lymphatic/Immunilogical: No cervical lymphadenopathy. Cardiovascular: Normal rate, regular rhythm. Grossly normal heart sounds.  Good peripheral circulation. Respiratory: Normal respiratory effort.  No retractions. No wheezes, rales or rhonchi. Good air movement.  Musculoskeletal: Steady gait. Neurologic:  Normal speech and language. No gross focal neurologic deficits are appreciated. No gait instability. Skin:  Skin is warm, dry and intact. No rash noted. Psychiatric: Mood and affect are normal. Speech and behavior are normal.  ___________________________________________   LABS (all labs ordered are listed, but only abnormal results are displayed)  Labs Reviewed - No data to display ____________________________________________  PROCEDURES Procedures   INITIAL IMPRESSION / ASSESSMENT AND PLAN / ED COURSE  Pertinent labs & imaging results that were available during my care of the patient were reviewed by me and considered in my medical decision making (see chart for details).  Well-appearing patient.  No acute distress.  Suspect secondary sinusitis.  Will treat with Augmentin, continue Mucinex and saline rinses.  Encourage rest, fluids and supportive care.Discussed indication, risks and benefits of medications with patient.  Discussed follow up with Primary care physician this week as needed. Discussed follow up and return parameters including no resolution or any worsening concerns. Patient verbalized understanding and agreed to plan.   ____________________________________________   FINAL CLINICAL IMPRESSION(S) / ED DIAGNOSES  Final diagnoses:  Acute maxillary sinusitis, recurrence not specified     ED  Discharge Orders         Ordered    amoxicillin-clavulanate (AUGMENTIN) 875-125 MG tablet  Every 12 hours     06/04/18 1441           Note: This dictation was prepared with Dragon dictation along with smaller phrase technology. Any transcriptional errors that result from this process are unintentional.         Renford Dills, NP 06/04/18 1459

## 2018-06-05 ENCOUNTER — Encounter: Payer: BLUE CROSS/BLUE SHIELD | Admitting: Obstetrics and Gynecology

## 2018-06-12 ENCOUNTER — Other Ambulatory Visit (HOSPITAL_COMMUNITY)
Admission: RE | Admit: 2018-06-12 | Discharge: 2018-06-12 | Disposition: A | Discharge status: Home / Self Care | Payer: BLUE CROSS/BLUE SHIELD | Source: Ambulatory Visit | Attending: Obstetrics and Gynecology | Admitting: Obstetrics and Gynecology

## 2018-06-12 ENCOUNTER — Other Ambulatory Visit: Payer: Self-pay | Admitting: Obstetrics and Gynecology

## 2018-06-12 ENCOUNTER — Ambulatory Visit (INDEPENDENT_AMBULATORY_CARE_PROVIDER_SITE_OTHER): Payer: BLUE CROSS/BLUE SHIELD | Admitting: Obstetrics and Gynecology

## 2018-06-12 ENCOUNTER — Encounter

## 2018-06-12 ENCOUNTER — Encounter: Payer: Self-pay | Admitting: Obstetrics and Gynecology

## 2018-06-12 VITALS — BP 133/89 | HR 81 | Ht 62.0 in | Wt 148.0 lb

## 2018-06-12 DIAGNOSIS — Z01419 Encounter for gynecological examination (general) (routine) without abnormal findings: Secondary | ICD-10-CM | POA: Diagnosis not present

## 2018-06-12 DIAGNOSIS — N920 Excessive and frequent menstruation with regular cycle: Secondary | ICD-10-CM | POA: Diagnosis not present

## 2018-06-12 DIAGNOSIS — Z1231 Encounter for screening mammogram for malignant neoplasm of breast: Secondary | ICD-10-CM

## 2018-06-12 NOTE — Patient Instructions (Signed)
Preventive Care 18-39 Years, Female Preventive care refers to lifestyle choices and visits with your health care provider that can promote health and wellness. What does preventive care include?  A yearly physical exam. This is also called an annual well check.  Dental exams once or twice a year.  Routine eye exams. Ask your health care provider how often you should have your eyes checked.  Personal lifestyle choices, including: ? Daily care of your teeth and gums. ? Regular physical activity. ? Eating a healthy diet. ? Avoiding tobacco and drug use. ? Limiting alcohol use. ? Practicing safe sex. ? Taking vitamin and mineral supplements as recommended by your health care provider. What happens during an annual well check? The services and screenings done by your health care provider during your annual well check will depend on your age, overall health, lifestyle risk factors, and family history of disease. Counseling Your health care provider may ask you questions about your:  Alcohol use.  Tobacco use.  Drug use.  Emotional well-being.  Home and relationship well-being.  Sexual activity.  Eating habits.  Work and work Statistician.  Method of birth control.  Menstrual cycle.  Pregnancy history.  Screening You may have the following tests or measurements:  Height, weight, and BMI.  Diabetes screening. This is done by checking your blood sugar (glucose) after you have not eaten for a while (fasting).  Blood pressure.  Lipid and cholesterol levels. These may be checked every 5 years starting at age 38.  Skin check.  Hepatitis C blood test.  Hepatitis B blood test.  Sexually transmitted disease (STD) testing.  BRCA-related cancer screening. This may be done if you have a family history of breast, ovarian, tubal, or peritoneal cancers.  Pelvic exam and Pap test. This may be done every 3 years starting at age 38. Starting at age 30, this may be done  every 5 years if you have a Pap test in combination with an HPV test.  Discuss your test results, treatment options, and if necessary, the need for more tests with your health care provider. Vaccines Your health care provider may recommend certain vaccines, such as:  Influenza vaccine. This is recommended every year.  Tetanus, diphtheria, and acellular pertussis (Tdap, Td) vaccine. You may need a Td booster every 10 years.  Varicella vaccine. You may need this if you have not been vaccinated.  HPV vaccine. If you are 39 or younger, you may need three doses over 6 months.  Measles, mumps, and rubella (MMR) vaccine. You may need at least one dose of MMR. You may also need a second dose.  Pneumococcal 13-valent conjugate (PCV13) vaccine. You may need this if you have certain conditions and were not previously vaccinated.  Pneumococcal polysaccharide (PPSV23) vaccine. You may need one or two doses if you smoke cigarettes or if you have certain conditions.  Meningococcal vaccine. One dose is recommended if you are age 68-21 years and a first-year college student living in a residence hall, or if you have one of several medical conditions. You may also need additional booster doses.  Hepatitis A vaccine. You may need this if you have certain conditions or if you travel or work in places where you may be exposed to hepatitis A.  Hepatitis B vaccine. You may need this if you have certain conditions or if you travel or work in places where you may be exposed to hepatitis B.  Haemophilus influenzae type b (Hib) vaccine. You may need this  if you have certain risk factors.  Talk to your health care provider about which screenings and vaccines you need and how often you need them. This information is not intended to replace advice given to you by your health care provider. Make sure you discuss any questions you have with your health care provider. Document Released: 10/09/2001 Document Revised:  05/02/2016 Document Reviewed: 06/14/2015 Elsevier Interactive Patient Education  2018 Elsevier Inc.  

## 2018-06-12 NOTE — Progress Notes (Signed)
Subjective:   Veronica Jenkins is a 37 y.o. G2P2 Caucasian female here for a routine well-woman exam.  Patient's last menstrual period was 06/03/2018.    Current complaints: anxiety attacks(seeing PCP for treatment), also reports menses are still heavy with large clots and then spotting occurs for about a week after menses stops. Mom had hysterectomy for either uterine polyps or fibroids and heavy bleeding.  Had flu vaccine already PCP: Lada       does desire labs  Social History: Sexual: heterosexual Marital Status: married Living situation: with family Occupation: Health and safety inspector Tobacco/alcohol: no tobacco use Illicit drugs: no history of illicit drug use  The following portions of the patient's history were reviewed and updated as appropriate: allergies, current medications, past family history, past medical history, past social history, past surgical history and problem list.  Past Medical History Past Medical History:  Diagnosis Date  . Anxiety     Past Surgical History Past Surgical History:  Procedure Laterality Date  . CESAREAN SECTION      Gynecologic History G2P2  Patient's last menstrual period was 06/03/2018. Contraception: vasectomy Last Pap: 2017. Results were: normal   Obstetric History OB History  Gravida Para Term Preterm AB Living  2 2          SAB TAB Ectopic Multiple Live Births               # Outcome Date GA Lbr Len/2nd Weight Sex Delivery Anes PTL Lv  2 Para 2011    F CS-Unspec     1 Para 2008    M CS-Unspec       Current Medications Current Outpatient Medications on File Prior to Visit  Medication Sig Dispense Refill  . escitalopram (LEXAPRO) 10 MG tablet Take 10 mg by mouth daily.    Marland Kitchen buPROPion (WELLBUTRIN XL) 150 MG 24 hr tablet Take 1 tablet (150 mg total) by mouth daily. (Patient not taking: Reported on 06/12/2018) 30 tablet 0   No current facility-administered medications on file prior to visit.     Review of  Systems Patient denies any headaches, blurred vision, shortness of breath, chest pain, abdominal pain, problems with bowel movements, urination, or intercourse.  Objective:  BP 133/89   Pulse 81   Ht 5\' 2"  (1.575 m)   Wt 148 lb (67.1 kg)   LMP 06/03/2018   BMI 27.07 kg/m  Physical Exam  General:  Well developed, well nourished, no acute distress. She is alert and oriented x3. Skin:  Warm and dry Neck:  Midline trachea, no thyromegaly or nodules Cardiovascular: Regular rate and rhythm, no murmur heard Lungs:  Effort normal, all lung fields clear to auscultation bilaterally Breasts:  No dominant palpable mass, retraction, or nipple discharge Abdomen:  Soft, non tender, no hepatosplenomegaly or masses Pelvic:  External genitalia is normal in appearance.  The vagina is normal in appearance. The cervix is bulbous, no CMT.  Thin prep pap is  done . Uterus is felt to be normal size, shape, and contour.  No adnexal masses or tenderness noted. Extremities:  No swelling or varicosities noted Psych:  She has a normal mood and affect  Assessment:   Healthy well-woman exam Menorrhagia with regular menses Anxiety   Plan:  Labs obtained-will follow up accordingly Pelvic ultrasound planned for after next menses.  F/U 1 year for AE, or sooner if needed Mammogram screening ordered.   Jermiah Soderman Suzan Nailer, CNM

## 2018-06-13 LAB — COMPREHENSIVE METABOLIC PANEL
ALBUMIN: 4.5 g/dL (ref 3.5–5.5)
ALK PHOS: 75 IU/L (ref 39–117)
ALT: 11 IU/L (ref 0–32)
AST: 16 IU/L (ref 0–40)
Albumin/Globulin Ratio: 2 (ref 1.2–2.2)
BUN / CREAT RATIO: 15 (ref 9–23)
BUN: 11 mg/dL (ref 6–20)
Bilirubin Total: <0.2 mg/dL (ref 0.0–1.2)
CALCIUM: 9.4 mg/dL (ref 8.7–10.2)
CO2: 24 mmol/L (ref 20–29)
CREATININE: 0.71 mg/dL (ref 0.57–1.00)
Chloride: 105 mmol/L (ref 96–106)
GFR, EST AFRICAN AMERICAN: 125 mL/min/{1.73_m2} (ref >59)
GFR, EST NON AFRICAN AMERICAN: 108 mL/min/{1.73_m2} (ref >59)
GLOBULIN, TOTAL: 2.2 g/dL (ref 1.5–4.5)
GLUCOSE: 83 mg/dL (ref 65–99)
Potassium: 4.6 mmol/L (ref 3.5–5.2)
Sodium: 145 mmol/L — ABNORMAL HIGH (ref 134–144)
TOTAL PROTEIN: 6.7 g/dL (ref 6.0–8.5)

## 2018-06-13 LAB — LIPID PANEL
CHOLESTEROL TOTAL: 194 mg/dL (ref 100–199)
Chol/HDL Ratio: 2.7 ratio (ref 0.0–4.4)
HDL: 71 mg/dL (ref >39)
LDL Calculated: 115 mg/dL — ABNORMAL HIGH (ref 0–99)
Triglycerides: 42 mg/dL (ref 0–149)
VLDL CHOLESTEROL CAL: 8 mg/dL (ref 5–40)

## 2018-06-13 LAB — CBC
Hematocrit: 37.6 % (ref 34.0–46.6)
Hemoglobin: 11.6 g/dL (ref 11.1–15.9)
MCH: 24.6 pg — AB (ref 26.6–33.0)
MCHC: 30.9 g/dL — AB (ref 31.5–35.7)
MCV: 80 fL (ref 79–97)
PLATELETS: 325 10*3/uL (ref 150–450)
RBC: 4.71 x10E6/uL (ref 3.77–5.28)
RDW: 14.5 % (ref 12.3–15.4)
WBC: 4.6 10*3/uL (ref 3.4–10.8)

## 2018-06-13 LAB — THYROID PANEL WITH TSH
FREE THYROXINE INDEX: 1.8 (ref 1.2–4.9)
T3 UPTAKE RATIO: 25 % (ref 24–39)
T4, Total: 7.2 ug/dL (ref 4.5–12.0)
TSH: 1.71 u[IU]/mL (ref 0.450–4.500)

## 2018-06-13 LAB — VITAMIN D 25 HYDROXY (VIT D DEFICIENCY, FRACTURES): Vit D, 25-Hydroxy: 34.1 ng/mL (ref 30.0–100.0)

## 2018-06-13 LAB — HEMOGLOBIN A1C
Est. average glucose Bld gHb Est-mCnc: 111 mg/dL
HEMOGLOBIN A1C: 5.5 % (ref 4.8–5.6)

## 2018-06-13 LAB — FERRITIN: Ferritin: 8 ng/mL — ABNORMAL LOW (ref 15–150)

## 2018-06-13 LAB — B12 AND FOLATE PANEL
FOLATE: 15.8 ng/mL (ref >3.0)
Vitamin B-12: 629 pg/mL (ref 232–1245)

## 2018-06-17 LAB — CYTOLOGY - PAP
Diagnosis: UNDETERMINED — AB
HPV (WINDOPATH): NOT DETECTED

## 2018-06-26 ENCOUNTER — Other Ambulatory Visit: Payer: Self-pay | Admitting: Family Medicine

## 2018-06-27 ENCOUNTER — Encounter: Payer: Self-pay | Admitting: *Deleted

## 2018-07-01 ENCOUNTER — Ambulatory Visit
Admission: RE | Admit: 2018-07-01 | Discharge: 2018-07-01 | Disposition: A | Discharge status: Home / Self Care | Payer: BLUE CROSS/BLUE SHIELD | Source: Ambulatory Visit | Attending: Obstetrics and Gynecology | Admitting: Obstetrics and Gynecology

## 2018-07-01 DIAGNOSIS — S76012A Strain of muscle, fascia and tendon of left hip, initial encounter: Secondary | ICD-10-CM | POA: Diagnosis not present

## 2018-07-01 DIAGNOSIS — M9904 Segmental and somatic dysfunction of sacral region: Secondary | ICD-10-CM | POA: Diagnosis not present

## 2018-07-01 DIAGNOSIS — Z1231 Encounter for screening mammogram for malignant neoplasm of breast: Secondary | ICD-10-CM | POA: Diagnosis not present

## 2018-07-01 DIAGNOSIS — S39012A Strain of muscle, fascia and tendon of lower back, initial encounter: Secondary | ICD-10-CM | POA: Diagnosis not present

## 2018-07-08 ENCOUNTER — Encounter: Payer: BLUE CROSS/BLUE SHIELD | Admitting: Obstetrics and Gynecology

## 2018-07-08 ENCOUNTER — Other Ambulatory Visit: Payer: BLUE CROSS/BLUE SHIELD

## 2018-07-17 ENCOUNTER — Encounter: Payer: Self-pay | Admitting: Obstetrics and Gynecology

## 2018-07-17 ENCOUNTER — Ambulatory Visit (INDEPENDENT_AMBULATORY_CARE_PROVIDER_SITE_OTHER): Payer: BLUE CROSS/BLUE SHIELD | Admitting: Obstetrics and Gynecology

## 2018-07-17 ENCOUNTER — Ambulatory Visit (INDEPENDENT_AMBULATORY_CARE_PROVIDER_SITE_OTHER): Payer: BLUE CROSS/BLUE SHIELD

## 2018-07-17 VITALS — BP 143/86 | HR 88 | Ht 62.0 in | Wt 146.1 lb

## 2018-07-17 DIAGNOSIS — N920 Excessive and frequent menstruation with regular cycle: Secondary | ICD-10-CM

## 2018-07-17 NOTE — Patient Instructions (Signed)
Dilation and Curettage or Vacuum Curettage Dilation and curettage (D&C) and vacuum curettage are minor procedures. A D&C involves stretching (dilation) the cervix and scraping (curettage) the inside lining of the uterus (endometrium). During a D&C, tissue is gently scraped from the endometrium, starting from the top portion of the uterus down to the lowest part of the uterus (cervix). During a vacuum curettage, the lining and tissue in the uterus are removed with the use of gentle suction. Curettage may be performed to either diagnose or treat a problem. As a diagnostic procedure, curettage is performed to examine tissues from the uterus. A diagnostic curettage may be done if you have:  Irregular bleeding in the uterus.  Bleeding with the development of clots.  Spotting between menstrual periods.  Prolonged menstrual periods or other abnormal bleeding.  Bleeding after menopause.  No menstrual period (amenorrhea).  A change in size and shape of the uterus.  Abnormal endometrial cells discovered during a Pap test.  As a treatment procedure, curettage may be performed for the following reasons:  Removal of an IUD (intrauterine device).  Removal of retained placenta after giving birth.  Abortion.  Miscarriage.  Removal of endometrial polyps.  Removal of uncommon types of noncancerous lumps (fibroids).  Tell a health care provider about:  Any allergies you have, including allergies to prescribed medicine or latex.  All medicines you are taking, including vitamins, herbs, eye drops, creams, and over-the-counter medicines. This is especially important if you take any blood-thinning medicine. Bring a list of all of your medicines to your appointment.  Any problems you or family members have had with anesthetic medicines.  Any blood disorders you have.  Any surgeries you have had.  Your medical history and any medical conditions you have.  Whether you are pregnant or may be  pregnant.  Recent vaginal infections you have had.  Recent menstrual periods, bleeding problems you have had, and what form of birth control (contraception) you use. What are the risks? Generally, this is a safe procedure. However, problems may occur, including:  Infection.  Heavy vaginal bleeding.  Allergic reactions to medicines.  Damage to the cervix or other structures or organs.  Development of scar tissue (adhesions) inside the uterus, which can cause abnormal amounts of menstrual bleeding. This may make it harder to get pregnant in the future.  A hole (perforation) or puncture in the uterine wall. This is rare.  What happens before the procedure? Staying hydrated Follow instructions from your health care provider about hydration, which may include:  Up to 2 hours before the procedure - you may continue to drink clear liquids, such as water, clear fruit juice, black coffee, and plain tea.  Eating and drinking restrictions Follow instructions from your health care provider about eating and drinking, which may include:  8 hours before the procedure - stop eating heavy meals or foods such as meat, fried foods, or fatty foods.  6 hours before the procedure - stop eating light meals or foods, such as toast or cereal.  6 hours before the procedure - stop drinking milk or drinks that contain milk.  2 hours before the procedure - stop drinking clear liquids. If your health care provider told you to take your medicine(s) on the day of your procedure, take them with only a sip of water.  Medicines  Ask your health care provider about: ? Changing or stopping your regular medicines. This is especially important if you are taking diabetes medicines or blood thinners. ? Taking   medicines such as aspirin and ibuprofen. These medicines can thin your blood. Do not take these medicines before your procedure if your health care provider instructs you not to.  You may be given antibiotic  medicine to help prevent infection. General instructions  For 24 hours before your procedure, do not: ? Douche. ? Use tampons. ? Use medicines, creams, or suppositories in the vagina. ? Have sexual intercourse.  You may be given a pregnancy test on the day of the procedure.  Plan to have someone take you home from the hospital or clinic.  You may have a blood or urine sample taken.  If you will be going home right after the procedure, plan to have someone with you for 24 hours. What happens during the procedure?  To reduce your risk of infection: ? Your health care team will wash or sanitize their hands. ? Your skin will be washed with soap.  An IV tube will be inserted into one of your veins.  You will be given one of the following: ? A medicine that numbs the area in and around the cervix (local anesthetic). ? A medicine to make you fall asleep (general anesthetic).  You will lie down on your back, with your feet in foot rests (stirrups).  The size and position of your uterus will be checked.  A lubricated instrument (speculum or Sims retractor) will be inserted into the back side of your vagina. The speculum will be used to hold apart the walls of your vagina so your health care provider can see your cervix.  A tool (tenaculum) will be attached to the lip of the cervix to stabilize it.  Your cervix will be softened and dilated. This may be done by: ? Taking a medicine. ? Having tapered dilators or thin rods (laminaria) or gradual widening instruments (tapered dilators) inserted into your cervix.  A small, sharp, curved instrument (curette) will be used to scrape a small amount of tissue or cells from the endometrium or cervical canal. In some cases, gentle suction is applied with the curette. The curette will then be removed. The cells will be taken to a lab for testing. The procedure may vary among health care providers and hospitals. What happens after the  procedure?  You may have mild cramping, backache, pain, and light bleeding or spotting. You may pass small blood clots from your vagina.  You may have to wear compression stockings. These stockings help to prevent blood clots and reduce swelling in your legs.  Your blood pressure, heart rate, breathing rate, and blood oxygen level will be monitored until the medicines you were given have worn off. Summary  Dilation and curettage (D&C) involves stretching (dilation) the cervix and scraping (curettage) the inside lining of the uterus (endometrium).  After the procedure, you may have mild cramping, backache, pain, and light bleeding or spotting. You may pass small blood clots from your vagina.  Plan to have someone take you home from the hospital or clinic. This information is not intended to replace advice given to you by your health care provider. Make sure you discuss any questions you have with your health care provider. Document Released: 08/13/2005 Document Revised: 04/29/2016 Document Reviewed: 04/29/2016 Elsevier Interactive Patient Education  2018 Elsevier Inc.  

## 2018-07-17 NOTE — Progress Notes (Signed)
  Subjective:     Patient ID: Veronica Jenkins, female   DOB: Sep 06, 1980, 37 y.o.   MRN: [id number]  HPI  Here to review pelvic ultrasound: See previous note. Having heavy and painful menses for last eight years, with the last year being much worse. No change since visit in October 2019.  Review of Systems  All other systems reviewed and are negative.      Objective:   Physical Exam Pelvic ultrasound today reveals:  Indications:AUB Findings:  The uterus is anteverted and measures 9.7x4.6x6.2cm. Echo texture is homogenous without evidence of focal masses. The Endometrium measures 5.4 mm.  Endocervical canal near the LUS has a hyperechoic structure = 1.4x0.5cm (Possibility of polyp vs others)  Right Ovary measures 5.5 cm3. It is normal in appearance. Left Ovary measures 9.4 cm3. It is normal in appearance. Survey of the adnexa demonstrates no adnexal masses. There is no free fluid in the cul de sac.  Impression: 1. Endocervical canal near the LUS has a hyperechoic structure = 1.4x0.5cm (Possibility of polyp vs others)    Assessment:     AUB Cervical polyp?    Plan:     Counseled at length on possible uterine polyp and concern for hyperplasia if left untested and untreated. Discussed D&C and patient declines this, also declined progesterone challenge or hormones in general.  Does request labs to check hormone levels to see if they could be causing anxiety. Fusion plus samples given to treat iron deficiency. RTC as needed. Will consider D&C in the future if bleeding worsens.    Melody Shambley,CNM

## 2018-07-19 LAB — TESTOSTERONE, FREE, TOTAL, SHBG
SEX HORMONE BINDING: 41.1 nmol/L (ref 24.6–122.0)
Testosterone, Free: 2 pg/mL (ref 0.0–4.2)
Testosterone: 24 ng/dL (ref 8–48)

## 2018-07-19 LAB — TSH: TSH: 1.99 u[IU]/mL (ref 0.450–4.500)

## 2018-07-19 LAB — PROGESTERONE: Progesterone: 0.2 ng/mL

## 2018-07-19 LAB — DHEA-SULFATE: DHEA SO4: 215.4 ug/dL (ref 57.3–279.2)

## 2018-07-19 LAB — ESTRADIOL: ESTRADIOL: 51.1 pg/mL

## 2018-08-18 DIAGNOSIS — S161XXA Strain of muscle, fascia and tendon at neck level, initial encounter: Secondary | ICD-10-CM | POA: Diagnosis not present

## 2018-08-18 DIAGNOSIS — S76012A Strain of muscle, fascia and tendon of left hip, initial encounter: Secondary | ICD-10-CM | POA: Diagnosis not present

## 2018-08-18 DIAGNOSIS — M9904 Segmental and somatic dysfunction of sacral region: Secondary | ICD-10-CM | POA: Diagnosis not present

## 2018-08-18 DIAGNOSIS — S39012A Strain of muscle, fascia and tendon of lower back, initial encounter: Secondary | ICD-10-CM | POA: Diagnosis not present

## 2018-08-29 DIAGNOSIS — M5117 Intervertebral disc disorders with radiculopathy, lumbosacral region: Secondary | ICD-10-CM | POA: Diagnosis not present

## 2018-08-29 DIAGNOSIS — M5116 Intervertebral disc disorders with radiculopathy, lumbar region: Secondary | ICD-10-CM | POA: Diagnosis not present

## 2018-08-29 DIAGNOSIS — M9903 Segmental and somatic dysfunction of lumbar region: Secondary | ICD-10-CM | POA: Diagnosis not present

## 2018-08-29 DIAGNOSIS — M9905 Segmental and somatic dysfunction of pelvic region: Secondary | ICD-10-CM | POA: Diagnosis not present

## 2018-09-10 DIAGNOSIS — M9903 Segmental and somatic dysfunction of lumbar region: Secondary | ICD-10-CM | POA: Diagnosis not present

## 2018-09-10 DIAGNOSIS — M9905 Segmental and somatic dysfunction of pelvic region: Secondary | ICD-10-CM | POA: Diagnosis not present

## 2018-09-10 DIAGNOSIS — M5117 Intervertebral disc disorders with radiculopathy, lumbosacral region: Secondary | ICD-10-CM | POA: Diagnosis not present

## 2018-09-10 DIAGNOSIS — M5116 Intervertebral disc disorders with radiculopathy, lumbar region: Secondary | ICD-10-CM | POA: Diagnosis not present

## 2018-10-08 DIAGNOSIS — M9903 Segmental and somatic dysfunction of lumbar region: Secondary | ICD-10-CM | POA: Diagnosis not present

## 2018-10-08 DIAGNOSIS — M5117 Intervertebral disc disorders with radiculopathy, lumbosacral region: Secondary | ICD-10-CM | POA: Diagnosis not present

## 2018-10-08 DIAGNOSIS — M5116 Intervertebral disc disorders with radiculopathy, lumbar region: Secondary | ICD-10-CM | POA: Diagnosis not present

## 2018-10-08 DIAGNOSIS — M9905 Segmental and somatic dysfunction of pelvic region: Secondary | ICD-10-CM | POA: Diagnosis not present

## 2018-10-12 ENCOUNTER — Ambulatory Visit
Admission: EM | Admit: 2018-10-12 | Discharge: 2018-10-12 | Disposition: A | Discharge status: Home / Self Care | Payer: BLUE CROSS/BLUE SHIELD | Attending: Emergency Medicine | Admitting: Emergency Medicine

## 2018-10-12 ENCOUNTER — Ambulatory Visit (INDEPENDENT_AMBULATORY_CARE_PROVIDER_SITE_OTHER): Payer: BLUE CROSS/BLUE SHIELD

## 2018-10-12 ENCOUNTER — Other Ambulatory Visit: Payer: Self-pay

## 2018-10-12 ENCOUNTER — Encounter: Payer: Self-pay | Admitting: Emergency Medicine

## 2018-10-12 DIAGNOSIS — R059 Cough, unspecified: Secondary | ICD-10-CM

## 2018-10-12 DIAGNOSIS — R05 Cough: Secondary | ICD-10-CM

## 2018-10-12 MED ORDER — IBUPROFEN 600 MG PO TABS: 600.0000 mg | ORAL_TABLET | Freq: Four times a day (QID) | ORAL | 0 refills | Status: AC | PRN

## 2018-10-12 MED ORDER — HYDROCOD POLST-CPM POLST ER 10-8 MG/5ML PO SUER: 5.0000 mL | Freq: Two times a day (BID) | ORAL | 0 refills | Status: DC | PRN

## 2018-10-12 MED ORDER — PREDNISONE 20 MG PO TABS: 40.0000 mg | ORAL_TABLET | Freq: Every day | ORAL | 0 refills | Status: AC

## 2018-10-12 MED ORDER — ALBUTEROL SULFATE HFA 108 (90 BASE) MCG/ACT IN AERS: 1.0000 | INHALATION_SPRAY | Freq: Four times a day (QID) | RESPIRATORY_TRACT | 0 refills | Status: DC | PRN

## 2018-10-12 MED ORDER — AEROCHAMBER PLUS MISC: 2 refills | Status: DC

## 2018-10-12 MED ORDER — BENZONATATE 200 MG PO CAPS: 200.0000 mg | ORAL_CAPSULE | Freq: Three times a day (TID) | ORAL | 0 refills | Status: DC | PRN

## 2018-10-12 MED ORDER — IPRATROPIUM-ALBUTEROL 0.5-2.5 (3) MG/3ML IN SOLN
3.0000 mL | Freq: Once | RESPIRATORY_TRACT | Status: AC
  Administered 2018-10-12: 3 mL via RESPIRATORY_TRACT

## 2018-10-12 NOTE — ED Triage Notes (Signed)
Patient c/o sinus congestion and pressure and cough for a week.  Patient states that her cough has gotten worse over the past 2 days.  Patient denies fevers.

## 2018-10-12 NOTE — ED Provider Notes (Addendum)
HPI  SUBJECTIVE:  Veronica Jenkins is a 38 y.o. female who presents with 2 weeks of sinus pain and pressure, postnasal drip which she states has resolved.  However she reports 1 week of of dry cough that was originally present at night only, but states that it is present all day now.  She reports chills, inability to sleep at night secondary to the cough.  No fevers, chest congestion, wheezing, chest pain, shortness of breath, dyspnea on exertion, GERD, allergy symptoms.  No pleuritic pain, hemoptysis, calf pain, calf swelling, exogenous estrogen, surgery in the past 4 weeks, recent immobilization..  No antibiotics in the past month.  She took TheraFlu within 4 to 6 hours of evaluation.  She has been taking Tylenol and Mucinex D with improvement of the sinus pain pressure/postnasal drip for the past week and TheraFlu.  Nothing makes her cough any better.  No aggravating factors.  Past medical history negative for hypertension, asthma, eczema, COPD, smoking, vaping, diabetes, GERD, PE, DVT.  LMP: Days ago.  Denies possibility being pregnant.  AYO:KHTX, Janit Bern, MD   Past Medical History:  Diagnosis Date  . Anxiety     Past Surgical History:  Procedure Laterality Date  . CESAREAN SECTION      Family History  Problem Relation Age of Onset  . Congestive Heart Failure Maternal Grandfather   . Heart disease Maternal Grandfather   . Diabetes Neg Hx   . Cancer Neg Hx   . Hypertension Neg Hx   . Stroke Neg Hx   . COPD Neg Hx   . Breast cancer Neg Hx     Social History   Tobacco Use  . Smoking status: Former Smoker    Packs/day: 0.50    Years: 10.00    Pack years: 5.00    Types: Cigarettes    Last attempt to quit: 06/07/2008    Years since quitting: 10.3  . Smokeless tobacco: Never Used  Substance Use Topics  . Alcohol use: Yes    Comment: rare  . Drug use: No    No current facility-administered medications for this encounter.   Current Outpatient Medications:  .  buPROPion  (WELLBUTRIN XL) 150 MG 24 hr tablet, TAKE 1 TABLET BY MOUTH EVERY DAY, Disp: 90 tablet, Rfl: 1 .  albuterol (PROVENTIL HFA;VENTOLIN HFA) 108 (90 Base) MCG/ACT inhaler, Inhale 1-2 puffs into the lungs every 6 (six) hours as needed for wheezing or shortness of breath., Disp: 1 Inhaler, Rfl: 0 .  benzonatate (TESSALON) 200 MG capsule, Take 1 capsule (200 mg total) by mouth 3 (three) times daily as needed for cough., Disp: 30 capsule, Rfl: 0 .  chlorpheniramine-HYDROcodone (TUSSIONEX PENNKINETIC ER) 10-8 MG/5ML SUER, Take 5 mLs by mouth every 12 (twelve) hours as needed for cough., Disp: 60 mL, Rfl: 0 .  ibuprofen (ADVIL,MOTRIN) 600 MG tablet, Take 1 tablet (600 mg total) by mouth every 6 (six) hours as needed., Disp: 30 tablet, Rfl: 0 .  predniSONE (DELTASONE) 20 MG tablet, Take 2 tablets (40 mg total) by mouth daily with breakfast for 5 days., Disp: 10 tablet, Rfl: 0 .  Spacer/Aero-Holding Chambers (AEROCHAMBER PLUS) inhaler, Use as instructed, Disp: 1 each, Rfl: 2  Allergies  Allergen Reactions  . Morphine Nausea Only and Nausea And Vomiting    Patient states she gets really sick     ROS  As noted in HPI.   Physical Exam  BP 125/89 (BP Location: Left Arm)   Pulse 100   Temp 98.5  F (36.9 C) (Oral)   Resp 14   Ht 5\' 2"  (1.575 m)   Wt 64.9 kg   LMP 10/05/2018 (Approximate)   SpO2 98%   BMI 26.16 kg/m   Constitutional: Well developed, well nourished, no acute distress.  Dry cough. Eyes:  EOMI, conjunctiva normal bilaterally HENT: Normocephalic, atraumatic,mucus membranes moist.  Slightly erythematous, swollen turbinates.  No nasal congestion.  No maxillary or frontal sinus tenderness.  Normal oropharynx.  Tonsils absent.  Positive cobblestoning.  No postnasal drip. Respiratory: Normal inspiratory effort fair air movement, diffuse expiratory wheezing.  No rales or rhonchi.  No chest wall tenderness. Cardiovascular: Normal rate, regular rhythm, no murmurs rubs or gallops GI:  nondistended skin: No rash, skin intact Musculoskeletal: no deformities calves symmetric, nontender, negative Homans, no palpable cord, no edema. Neurologic: Alert & oriented x 3, no focal neuro deficits Psychiatric: Speech and behavior appropriate   ED Course   Medications  ipratropium-albuterol (DUONEB) 0.5-2.5 (3) MG/3ML nebulizer solution 3 mL (3 mLs Nebulization Given 10/12/18 0857)    Orders Placed This Encounter  Procedures  . DG Chest 2 View    Standing Status:   Standing    Number of Occurrences:   1    Order Specific Question:   Reason for Exam (SYMPTOM  OR DIAGNOSIS REQUIRED)    Answer:   coug diffuse wheezing r/o PNA    No results found for this or any previous visit (from the past 24 hour(s)). Dg Chest 2 View  Result Date: 10/12/2018 CLINICAL DATA:  One-week history of cough and wheezing. EXAM: CHEST - 2 VIEW COMPARISON:  None. FINDINGS: Cardiomediastinal silhouette unremarkable. Lungs clear. Bronchovascular markings normal. Pulmonary vascularity normal. No pneumothorax. No pleural effusions. Visualized bony thorax intact. IMPRESSION: Normal examination. Electronically Signed   By: Hulan Saas M.D.   On: 10/12/2018 09:10    ED Clinical Impression  Cough   ED Assessment/Plan  Patient with diffuse wheezing.  Suspect bronchospasm causing her cough.  We will give DuoNeb.  Checking chest x-ray to rule out pneumonia.  Will reevaluate.  Reviewed imaging independently.  Normal chest x-ray.  See radiology report for full details.  [place] Narcotic database reviewed for this patient, and feel that the risk/benefit ratio today is favorable for proceeding with a prescription for controlled substance.  No Opiate prescriptions in 2 years.  On reevaluation, patient states that she feels slightly better.  Improved air movement.  Single wheezing in the left lower lobe.  No rales, rhonchi.  Suspect that her cough is coming either from postnasal drip or from bronchitis.  No  evidence of pneumonia.  Doubt PE.  Will send home with Tessalon, Tussionex, and albuterol inhaler with a spacer, continue Mucinex D, may add ibuprofen 600 mg to the Tylenol as needed, Flonase if her nasal congestion/postnasal drip returns.  Follow-up with her PMD in 5 days if not getting any better for consideration of antibiotics at that time.  Deferring antibiotics today because she has no nasal congestion, sinus tenderness, pneumonia on x-ray.  Discussed imaging, MDM, treatment plan, and plan for follow-up with patient. Discussed sn/sx that should prompt return to the ED. patient agrees with plan.   Meds ordered this encounter  Medications  . ipratropium-albuterol (DUONEB) 0.5-2.5 (3) MG/3ML nebulizer solution 3 mL  . albuterol (PROVENTIL HFA;VENTOLIN HFA) 108 (90 Base) MCG/ACT inhaler    Sig: Inhale 1-2 puffs into the lungs every 6 (six) hours as needed for wheezing or shortness of breath.    Dispense:  1 Inhaler    Refill:  0  . Spacer/Aero-Holding Chambers (AEROCHAMBER PLUS) inhaler    Sig: Use as instructed    Dispense:  1 each    Refill:  2  . chlorpheniramine-HYDROcodone (TUSSIONEX PENNKINETIC ER) 10-8 MG/5ML SUER    Sig: Take 5 mLs by mouth every 12 (twelve) hours as needed for cough.    Dispense:  60 mL    Refill:  0  . benzonatate (TESSALON) 200 MG capsule    Sig: Take 1 capsule (200 mg total) by mouth 3 (three) times daily as needed for cough.    Dispense:  30 capsule    Refill:  0  . predniSONE (DELTASONE) 20 MG tablet    Sig: Take 2 tablets (40 mg total) by mouth daily with breakfast for 5 days.    Dispense:  10 tablet    Refill:  0  . ibuprofen (ADVIL,MOTRIN) 600 MG tablet    Sig: Take 1 tablet (600 mg total) by mouth every 6 (six) hours as needed.    Dispense:  30 tablet    Refill:  0    *This clinic note was created using Scientist, clinical (histocompatibility and immunogenetics)Dragon dictation software. Therefore, there may be occasional mistakes despite careful proofreading.   ?   Domenick GongMortenson, Yarisbel Miranda, MD 10/13/18  Darrel [id number]    Domenick GongMortenson, Kalup Jaquith, MD 10/13/18 [phone]

## 2018-10-12 NOTE — Discharge Instructions (Addendum)
Tessalon for cough during the day, Tussionex cough at night, and puffs from your albuterol inhaler with a spacer 4 to 6 hours, continue Mucinex D, may add ibuprofen 600 mg to the Tylenol as needed, Flonase if your nasal congestion/postnasal drip returns

## 2018-10-13 ENCOUNTER — Encounter: Payer: Self-pay | Admitting: Nurse Practitioner

## 2018-10-13 ENCOUNTER — Ambulatory Visit (INDEPENDENT_AMBULATORY_CARE_PROVIDER_SITE_OTHER): Payer: BLUE CROSS/BLUE SHIELD | Admitting: Nurse Practitioner

## 2018-10-13 ENCOUNTER — Ambulatory Visit: Payer: Self-pay | Admitting: *Deleted

## 2018-10-13 VITALS — BP 132/80 | HR 119 | Temp 99.0°F | Resp 18 | Ht 62.0 in | Wt 142.9 lb

## 2018-10-13 DIAGNOSIS — R509 Fever, unspecified: Secondary | ICD-10-CM | POA: Diagnosis not present

## 2018-10-13 DIAGNOSIS — R000 Tachycardia, unspecified: Secondary | ICD-10-CM | POA: Diagnosis not present

## 2018-10-13 NOTE — Telephone Encounter (Signed)
Summary: Call back    Patient was seen at a [place] walk in clinic yesterday she was prescribed Prednisone, Allbuterol, cough syrup she is feeling worse today than yesterday, she is having rapid heart rates since starting meds, please advise  Best call back is [phone]     Patient was treated for cough- but has increased fever and body aches. Patient reports her heart rate is 150- this has never happened with prednisone or inhaler before. Patient has temperature 101. Call to office- they will see her.  Reason for Disposition . [1] Heart beating very rapidly (e.g., > 140 / minute) AND [2] present now  (Exception: during exercise)  Answer Assessment - Initial Assessment Questions 1. DESCRIPTION: "Please describe your heart rate or heart beat that you are having" (e.g., fast/slow, regular/irregular, skipped or extra beats, "palpitations")     fast 2. ONSET: "When did it start?" (Minutes, hours or days)      After starting the medication 3. DURATION: "How long does it last" (e.g., seconds, minutes, hours)     All day long- patient didn't feel it this morning but has been constant since 11:00 4. PATTERN "Does it come and go, or has it been constant since it started?"  "Does it get worse with exertion?"   "Are you feeling it now?"     constant 5. TAP: "Using your hand, can you tap out what you are feeling on a chair or table in front of you, so that I can hear?" (Note: not all patients can do this)       regular 6. HEART RATE: "Can you tell me your heart rate?" "How many beats in 15 seconds?"  (Note: not all patients can do this)       150 now 7. RECURRENT SYMPTOM: "Have you ever had this before?" If so, ask: "When was the last time?" and "What happened that time?"      Never before with medication 8. CAUSE: "What do you think is causing the palpitations?"     Possible medication reaction 9. CARDIAC HISTORY: "Do you have any history of heart disease?" (e.g., heart attack, angina, bypass  surgery, angioplasty, arrhythmia)      no 10. OTHER SYMPTOMS: "Do you have any other symptoms?" (e.g., dizziness, chest pain, sweating, difficulty breathing)       No- chills 11. PREGNANCY: "Is there any chance you are pregnant?" "When was your last menstrual period?"       No- LMP- 7 days  Protocols used: HEART RATE AND HEARTBEAT QUESTIONS-A-AH

## 2018-10-13 NOTE — Progress Notes (Signed)
Rial Name: Veronica Jenkins   MRN: [id number]    DOB: 10-06-1980   Date:10/13/2018       Progress Note  Subjective  Chief Complaint  Chief Complaint  Patient presents with  . Tachycardia    started this morning  . Excessive Sweating  . Clammy  . dry mouth    HPI  Last week had sinus pressure- took musinex-D states this resolved on Friday. Went to urgent care yesterday due to dry cough that worsened the past 2 weeks - states it has been going on throughout the night and was unable to get any rest. Yesterday spike a fever-101.22F and started to get body aches and chills. Took tussinex which resolved cough. Negative chest x-ray. Took albuterol this morning which made her feel very jittery. States late morning felt palpitations and has had them ever since then. Has been drinking a lot of water. Has 2 cups of caffeine a day. Has history of anemia- uterine polyp; was recently on period.   Patient Active Problem List   Diagnosis Date Noted  . Generalized anxiety disorder 05/08/2018  . Depression 05/08/2018  . PAC (premature atrial contraction) 02/05/2018  . Mitral regurgitation 02/05/2018  . Preventative health care 06/19/2017  . Anxiety about health 05/06/2017  . Right hip pain 05/06/2017  . Adolescent idiopathic scoliosis of lumbar region 09/08/2015  . Overweight (BMI 25.0-29.9) 06/08/2015    Past Medical History:  Diagnosis Date  . Anxiety     Past Surgical History:  Procedure Laterality Date  . CESAREAN SECTION      Social History   Tobacco Use  . Smoking status: Former Smoker    Packs/day: 0.50    Years: 10.00    Pack years: 5.00    Types: Cigarettes    Last attempt to quit: 06/07/2008    Years since quitting: 10.3  . Smokeless tobacco: Never Used  Substance Use Topics  . Alcohol use: Yes    Comment: rare     Current Outpatient Medications:  .  albuterol (PROVENTIL HFA;VENTOLIN HFA) 108 (90 Base) MCG/ACT inhaler, Inhale 1-2 puffs into the lungs every 6 (six)  hours as needed for wheezing or shortness of breath., Disp: 1 Inhaler, Rfl: 0 .  benzonatate (TESSALON) 200 MG capsule, Take 1 capsule (200 mg total) by mouth 3 (three) times daily as needed for cough., Disp: 30 capsule, Rfl: 0 .  buPROPion (WELLBUTRIN XL) 150 MG 24 hr tablet, TAKE 1 TABLET BY MOUTH EVERY DAY, Disp: 90 tablet, Rfl: 1 .  chlorpheniramine-HYDROcodone (TUSSIONEX PENNKINETIC ER) 10-8 MG/5ML SUER, Take 5 mLs by mouth every 12 (twelve) hours as needed for cough., Disp: 60 mL, Rfl: 0 .  ibuprofen (ADVIL,MOTRIN) 600 MG tablet, Take 1 tablet (600 mg total) by mouth every 6 (six) hours as needed., Disp: 30 tablet, Rfl: 0 .  predniSONE (DELTASONE) 20 MG tablet, Take 2 tablets (40 mg total) by mouth daily with breakfast for 5 days., Disp: 10 tablet, Rfl: 0 .  Spacer/Aero-Holding Chambers (AEROCHAMBER PLUS) inhaler, Use as instructed, Disp: 1 each, Rfl: 2  Allergies  Allergen Reactions  . Morphine Nausea Only and Nausea And Vomiting    Patient states she gets really sick    ROS   No other specific complaints in a complete review of systems (except as listed in HPI above).  Objective  Vitals:   10/13/18 1508 10/13/18 1545  BP: 132/80   Pulse: (!) 121 (!) 119  Resp: 18   Temp: 99 F (37.2 C)  TempSrc: Oral   SpO2: 98% 96%  Weight: 142 lb 14.4 oz (64.8 kg)   Height: 5\' 2"  (1.575 m)     Body mass index is 26.14 kg/m.  Nursing Note and Vital Signs reviewed.  Physical Exam Constitutional:      General: She is not in acute distress.    Appearance: Normal appearance. She is not ill-appearing.  HENT:     Head: Normocephalic and atraumatic.     Nose: Congestion present.     Mouth/Throat:     Mouth: Mucous membranes are moist.     Pharynx: Oropharynx is clear. No posterior oropharyngeal erythema.  Eyes:     Conjunctiva/sclera: Conjunctivae normal.  Cardiovascular:     Rate and Rhythm: Regular rhythm. Tachycardia present.     Pulses: Normal pulses.  Pulmonary:      Effort: Pulmonary effort is normal.     Breath sounds: Normal breath sounds. No wheezing.  Lymphadenopathy:     Cervical: No cervical adenopathy.  Skin:    General: Skin is warm and moist.     Findings: No erythema.  Neurological:     Mental Status: She is alert.  Psychiatric:        Mood and Affect: Mood normal.        Behavior: Behavior normal.      No results found for this or any previous visit (from the past 48 hour(s)).  Assessment & Plan 1. Tachycardia - CBC w/Diff/Platelet - TSH - EKG 12-Lead sinus tachycardia without ectopy.  Rate of 125 bpm. - Basic Metabolic Panel (BMET)  2. Fever, unspecified fever cause Please take 1000mg  of acetaminophen every 8 hours start this 4 hours after your acetaminophen ibuprofen 600mg  every 8 hours- with food    Patient presents to clinic for palpitations and fever was recently seen in urgent care and diagnosed with URI and likely bronchitis prescribed prednisone and albuterol.  Previously she was taking an OTC decongestant.  She is tachycardic in office with heart rate between 119 and 135.  Patient ambulated in hallways and heart rate remained at 120.  Patient has no shortness of breath no wheezing auscultated after ambulation.  Cough is adequately suppressed with Tussionex.  Had long discussion with patient about follow-up with ER versus close outpatient follow-up.  No recent long trips, non-smoker, not on birth control, low suspicion for DVT.  Tachycardia can be due due to viral illness, dehydration, electrolyte imbalance, increased caffeine intake, anemia etc.  Will have labs completed at the hospital to get back results right away.  Discussed strict ER precautions.  Patient wears apple watch that will alert her of heart rate-she seems of sound mind to be able to be managed outpatient instead of ER referral right now.  Patient agreeable to this plan.   -Red flags and when to present for emergency care or RTC including fever >101.47F, chest  pain, shortness of breath, new/worsening/un-resolving symptoms, hest pain, shortness of breath, heart rate greater than 150 bpm at any time, or with severe weakness or unimproved palpitations reviewed with patient at time of visit. Follow up and care instructions discussed and provided in AVS. Face-to-face time with patient was more than 25 minutes, >50% time spent counseling and coordination of care

## 2018-10-13 NOTE — Patient Instructions (Addendum)
-   Please take 1000mg  of acetaminophen every 8 hours start this 4 hours after your acetaminophen ibuprofen 600mg  every 8 hours- with food   - Drink at least 64 ounces of water a day. Avoid all sources of caffeine for the next week.  -Do not use albuterol at this time and also avoid these OTC meds: phenylephrine, phenylpropanolamine, and pseudoephredine - Please go to the ER with chest pain, shortness of breath, heart rate greater than 150 bpm at any time, or with severe weakness or unimproved palpitations.

## 2018-10-13 NOTE — Addendum Note (Signed)
Addended by: Idelle Crouch A on: 10/13/2018 04:05 PM   Modules accepted: Orders

## 2018-10-13 NOTE — Telephone Encounter (Signed)
I think she was just seen here, as I passed her in the hallway Yes, patient just seen by another provider

## 2018-10-14 ENCOUNTER — Ambulatory Visit (INDEPENDENT_AMBULATORY_CARE_PROVIDER_SITE_OTHER): Payer: BLUE CROSS/BLUE SHIELD | Admitting: Nurse Practitioner

## 2018-10-14 ENCOUNTER — Encounter: Payer: Self-pay | Admitting: Nurse Practitioner

## 2018-10-14 VITALS — BP 124/76 | HR 102 | Temp 98.1°F | Resp 16 | Ht 62.0 in | Wt 144.8 lb

## 2018-10-14 DIAGNOSIS — R000 Tachycardia, unspecified: Secondary | ICD-10-CM

## 2018-10-14 DIAGNOSIS — J101 Influenza due to other identified influenza virus with other respiratory manifestations: Secondary | ICD-10-CM | POA: Diagnosis not present

## 2018-10-14 LAB — CBC WITH DIFFERENTIAL/PLATELET
Absolute Monocytes: 168 cells/uL — ABNORMAL LOW (ref 200–950)
Basophils Absolute: 10 cells/uL (ref 0–200)
Basophils Relative: 0.2 %
Eosinophils Absolute: 0 cells/uL — ABNORMAL LOW (ref 15–500)
Eosinophils Relative: 0 %
HCT: 37.6 % (ref 35.0–45.0)
Hemoglobin: 12.7 g/dL (ref 11.7–15.5)
Lymphs Abs: 269 cells/uL — ABNORMAL LOW (ref 850–3900)
MCH: 28 pg (ref 27.0–33.0)
MCHC: 33.8 g/dL (ref 32.0–36.0)
MCV: 82.8 fL (ref 80.0–100.0)
MPV: 10.1 fL (ref 7.5–12.5)
Monocytes Relative: 3.5 %
NEUTROS ABS: 4354 {cells}/uL (ref 1500–7800)
Neutrophils Relative %: 90.7 %
Platelets: 257 10*3/uL (ref 140–400)
RBC: 4.54 10*6/uL (ref 3.80–5.10)
RDW: 14.2 % (ref 11.0–15.0)
Total Lymphocyte: 5.6 %
WBC: 4.8 10*3/uL (ref 3.8–10.8)

## 2018-10-14 LAB — TSH: TSH: 0.36 mIU/L — ABNORMAL LOW

## 2018-10-14 LAB — BASIC METABOLIC PANEL
BUN: 8 mg/dL (ref 7–25)
CO2: 26 mmol/L (ref 20–32)
CREATININE: 0.78 mg/dL (ref 0.50–1.10)
Calcium: 9.4 mg/dL (ref 8.6–10.2)
Chloride: 102 mmol/L (ref 98–110)
Glucose, Bld: 106 mg/dL — ABNORMAL HIGH (ref 65–99)
Potassium: 3.8 mmol/L (ref 3.5–5.3)
Sodium: 137 mmol/L (ref 135–146)

## 2018-10-14 LAB — POCT INFLUENZA A/B
Influenza A, POC: POSITIVE — AB
Influenza B, POC: NEGATIVE

## 2018-10-14 NOTE — Patient Instructions (Signed)

## 2018-10-14 NOTE — Progress Notes (Signed)
Name: Veronica Jenkins   MRN: [id number]    DOB: [birth year]   Date:10/14/2018       Progress Note  Subjective  Chief Complaint  Chief Complaint  Patient presents with  . Follow-up    HPI  Patient presents for one day follow-up on tachycardia and flu like illness. Patient has increased water intake been alternating between tylenol and ibuprofen, cut out caffeine and avoiding albuterol and oral decongestants and heart rate has been between 90-109bpm since then. Patient notes increased nasal congestions and fatigue denies shortness of breath or palpitations.   Patient Active Problem List   Diagnosis Date Noted  . Generalized anxiety disorder 05/08/2018  . Depression 05/08/2018  . PAC (premature atrial contraction) 02/05/2018  . Mitral regurgitation 02/05/2018  . Preventative health care 06/19/2017  . Anxiety about health 05/06/2017  . Right hip pain 05/06/2017  . Adolescent idiopathic scoliosis of lumbar region 09/08/2015  . Overweight (BMI 25.0-29.9) 06/08/2015    Past Medical History:  Diagnosis Date  . Anxiety     Past Surgical History:  Procedure Laterality Date  . CESAREAN SECTION      Social History   Tobacco Use  . Smoking status: Former Smoker    Packs/day: 0.50    Years: 10.00    Pack years: 5.00    Types: Cigarettes    Last attempt to quit: 06/07/2008    Years since quitting: 10.3  . Smokeless tobacco: Never Used  Substance Use Topics  . Alcohol use: Yes    Comment: rare     Current Outpatient Medications:  .  albuterol (PROVENTIL HFA;VENTOLIN HFA) 108 (90 Base) MCG/ACT inhaler, Inhale 1-2 puffs into the lungs every 6 (six) hours as needed for wheezing or shortness of breath., Disp: 1 Inhaler, Rfl: 0 .  benzonatate (TESSALON) 200 MG capsule, Take 1 capsule (200 mg total) by mouth 3 (three) times daily as needed for cough., Disp: 30 capsule, Rfl: 0 .  buPROPion (WELLBUTRIN XL) 150 MG 24 hr tablet, TAKE 1 TABLET BY MOUTH EVERY DAY, Disp: 90 tablet, Rfl:  1 .  chlorpheniramine-HYDROcodone (TUSSIONEX PENNKINETIC ER) 10-8 MG/5ML SUER, Take 5 mLs by mouth every 12 (twelve) hours as needed for cough., Disp: 60 mL, Rfl: 0 .  ibuprofen (ADVIL,MOTRIN) 600 MG tablet, Take 1 tablet (600 mg total) by mouth every 6 (six) hours as needed., Disp: 30 tablet, Rfl: 0 .  predniSONE (DELTASONE) 20 MG tablet, Take 2 tablets (40 mg total) by mouth daily with breakfast for 5 days., Disp: 10 tablet, Rfl: 0 .  Spacer/Aero-Holding Chambers (AEROCHAMBER PLUS) inhaler, Use as instructed, Disp: 1 each, Rfl: 2  Allergies  Allergen Reactions  . Morphine Nausea Only and Nausea And Vomiting    Patient states she gets really sick    ROS    No other specific complaints in a complete review of systems (except as listed in HPI above).  Objective  Vitals:   10/14/18 1134  BP: 124/76  Pulse: (!) 102  Resp: 16  Temp: 98.1 F (36.7 C)  TempSrc: Oral  SpO2: 96%  Weight: 144 lb 12.8 oz (65.7 kg)  Height: 5\' 2"  (1.575 m)    Body mass index is 26.48 kg/m.  Nursing Note and Vital Signs reviewed.  Physical Exam Constitutional:      Appearance: Normal appearance. She is well-developed.  HENT:     Head: Normocephalic and atraumatic.     Right Ear: Hearing normal.     Left Ear: Hearing normal.  Eyes:  Conjunctiva/sclera: Conjunctivae normal.  Neck:     Thyroid: No thyroid mass, thyromegaly or thyroid tenderness.  Cardiovascular:     Rate and Rhythm: Regular rhythm. Tachycardia present.     Pulses: Normal pulses.     Heart sounds: Normal heart sounds.  Pulmonary:     Effort: Pulmonary effort is normal.     Breath sounds: Normal breath sounds.  Musculoskeletal: Normal range of motion.  Neurological:     Mental Status: She is alert and oriented to person, place, and time.  Psychiatric:        Speech: Speech normal.        Behavior: Behavior normal. Behavior is cooperative.        Thought Content: Thought content normal.        Judgment: Judgment  normal.       Results for orders placed or performed in visit on 10/13/18 (from the past 48 hour(s))  Basic Metabolic Panel (BMET)     Status: Abnormal   Collection Time: 10/13/18  4:08 PM  Result Value Ref Range   Glucose, Bld 106 (H) 65 - 99 mg/dL    Comment: .            Fasting reference interval . For someone without known diabetes, a glucose value between 100 and 125 mg/dL is consistent with prediabetes and should be confirmed with a follow-up test. .    BUN 8 7 - 25 mg/dL   Creat 1.61 0.96 - 0.45 mg/dL   BUN/Creatinine Ratio NOT APPLICABLE 6 - 22 (calc)   Sodium 137 135 - 146 mmol/L   Potassium 3.8 3.5 - 5.3 mmol/L   Chloride 102 98 - 110 mmol/L   CO2 26 20 - 32 mmol/L   Calcium 9.4 8.6 - 10.2 mg/dL  CBC w/Diff/Platelet     Status: Abnormal   Collection Time: 10/13/18  4:08 PM  Result Value Ref Range   WBC 4.8 3.8 - 10.8 Thousand/uL   RBC 4.54 3.80 - 5.10 Million/uL   Hemoglobin 12.7 11.7 - 15.5 g/dL   HCT 40.9 81.1 - 91.4 %   MCV 82.8 80.0 - 100.0 fL   MCH 28.0 27.0 - 33.0 pg   MCHC 33.8 32.0 - 36.0 g/dL   RDW 78.2 95.6 - 21.3 %   Platelets 257 140 - 400 Thousand/uL   MPV 10.1 7.5 - 12.5 fL   Neutro Abs 4,354 1,500 - 7,800 cells/uL   Lymphs Abs 269 (L) 850 - 3,900 cells/uL   Absolute Monocytes 168 (L) 200 - 950 cells/uL   Eosinophils Absolute 0 (L) 15 - 500 cells/uL   Basophils Absolute 10 0 - 200 cells/uL   Neutrophils Relative % 90.7 %   Total Lymphocyte 5.6 %   Monocytes Relative 3.5 %   Eosinophils Relative 0.0 %   Basophils Relative 0.2 %  TSH     Status: Abnormal   Collection Time: 10/13/18  4:08 PM  Result Value Ref Range   TSH 0.36 (L) mIU/L    Comment:           Reference Range .           > or = 20 Years  0.40-4.50 .                Pregnancy Ranges           First trimester    0.26-2.66           Second trimester   0.55-2.73  Third trimester    0.43-2.91     Assessment & Plan  1. Tachycardia Resolving, continue course of  treatmetn recheck TSH in 8-12 weeks.   2. Influenza A Discussed OTC management     -Red flags and when to present for emergency care or RTC including fever >101.5F, chest pain, shortness of breath, new/worsening/un-resolving symptoms,  reviewed with patient at time of visit. Follow up and care instructions discussed and provided in AVS.

## 2018-10-19 ENCOUNTER — Telehealth: Payer: BLUE CROSS/BLUE SHIELD | Admitting: Family

## 2018-10-19 DIAGNOSIS — B9689 Other specified bacterial agents as the cause of diseases classified elsewhere: Secondary | ICD-10-CM | POA: Diagnosis not present

## 2018-10-19 DIAGNOSIS — J329 Chronic sinusitis, unspecified: Secondary | ICD-10-CM

## 2018-10-19 DIAGNOSIS — R059 Cough, unspecified: Secondary | ICD-10-CM

## 2018-10-19 DIAGNOSIS — R05 Cough: Secondary | ICD-10-CM

## 2018-10-19 MED ORDER — ALBUTEROL SULFATE HFA 108 (90 BASE) MCG/ACT IN AERS: 1.0000 | INHALATION_SPRAY | Freq: Four times a day (QID) | RESPIRATORY_TRACT | 0 refills | Status: DC | PRN

## 2018-10-19 MED ORDER — AMOXICILLIN-POT CLAVULANATE 875-125 MG PO TABS: 1.0000 | ORAL_TABLET | Freq: Two times a day (BID) | ORAL | 0 refills | Status: AC

## 2018-10-19 NOTE — Progress Notes (Signed)
Greater than 5 minutes, yet less than 10 minutes of time have been spent researching, coordinating, and implementing care for this patient today.  Thank you for the details you included in the comment boxes. Those details are very helpful in determining the best course of treatment for you and help [place] to provide the best care.  I saw your visit with Lance Coon, NP. I see that you were not given any treatment for the influenza. Perhaps this was called into a pharmacy off the record. Nonetheless, I see the + influeza A test on file from that day. Given your total symptom time, you are on 7-8 days with total symptoms. Bacterial sinusitis is a common co-infection or post-infection if flu as the mucosal irritation sets a nice breeding ground for bacteria to thrive. Given your history of respiratory problems and current use of an inhaler, we be more lenient with the guidelines and will give antibiotics as below. I also refilled your albuterol inhaler.   We are sorry that you are not feeling well.  Here is how we plan to help!  Based on what you have shared with me it looks like you have sinusitis.  Sinusitis is inflammation and infection in the sinus cavities of the head.  Based on your presentation I believe you most likely have Acute Bacterial Sinusitis.  This is an infection caused by bacteria and is treated with antibiotics. I have prescribed Augmentin 875mg /125mg  one tablet twice daily with food, for 7 days. You may use an oral decongestant such as Mucinex D or if you have glaucoma or high blood pressure use plain Mucinex. Saline nasal spray help and can safely be used as often as needed for congestion.  If you develop worsening sinus pain, fever or notice severe headache and vision changes, or if symptoms are not better after completion of antibiotic, please schedule an appointment with a health care provider.    Sinus infections are not as easily transmitted as other respiratory infection, however we still  recommend that you avoid close contact with loved ones, especially the very young and elderly.  Remember to wash your hands thoroughly throughout the day as this is the number one way to prevent the spread of infection!  Home Care:  Only take medications as instructed by your medical team.  Complete the entire course of an antibiotic.  Do not take these medications with alcohol.  A steam or ultrasonic humidifier can help congestion.  You can place a towel over your head and breathe in the steam from hot water coming from a faucet.  Avoid close contacts especially the very young and the elderly.  Cover your mouth when you cough or sneeze.  Always remember to wash your hands.  Get Help Right Away If:  You develop worsening fever or sinus pain.  You develop a severe head ache or visual changes.  Your symptoms persist after you have completed your treatment plan.  Make sure you  Understand these instructions.  Will watch your condition.  Will get help right away if you are not doing well or get worse.  Your e-visit answers were reviewed by a board certified advanced clinical practitioner to complete your personal care plan.  Depending on the condition, your plan could have included both over the counter or prescription medications.  If there is a problem please reply  once you have received a response from your provider.  Your safety is important to [place].  If you have drug allergies check your  prescription carefully.    You can use MyChart to ask questions about today's visit, request a non-urgent call back, or ask for a work or school excuse for 24 hours related to this e-Visit. If it has been greater than 24 hours you will need to follow up with your provider, or enter a new e-Visit to address those concerns.  You will get an e-mail in the next two days asking about your experience.  I hope that your e-visit has been valuable and will speed your recovery. Thank you for using  e-visits.

## 2018-11-13 DIAGNOSIS — M5116 Intervertebral disc disorders with radiculopathy, lumbar region: Secondary | ICD-10-CM | POA: Diagnosis not present

## 2018-11-13 DIAGNOSIS — M5117 Intervertebral disc disorders with radiculopathy, lumbosacral region: Secondary | ICD-10-CM | POA: Diagnosis not present

## 2018-11-13 DIAGNOSIS — M9903 Segmental and somatic dysfunction of lumbar region: Secondary | ICD-10-CM | POA: Diagnosis not present

## 2018-11-13 DIAGNOSIS — M9905 Segmental and somatic dysfunction of pelvic region: Secondary | ICD-10-CM | POA: Diagnosis not present

## 2018-11-16 ENCOUNTER — Encounter: Payer: Self-pay | Admitting: Family Medicine

## 2018-11-20 ENCOUNTER — Ambulatory Visit: Payer: BLUE CROSS/BLUE SHIELD | Admitting: Family Medicine

## 2018-11-21 ENCOUNTER — Ambulatory Visit: Payer: BLUE CROSS/BLUE SHIELD | Admitting: Family Medicine

## 2018-12-11 DIAGNOSIS — M9903 Segmental and somatic dysfunction of lumbar region: Secondary | ICD-10-CM | POA: Diagnosis not present

## 2018-12-11 DIAGNOSIS — M9905 Segmental and somatic dysfunction of pelvic region: Secondary | ICD-10-CM | POA: Diagnosis not present

## 2018-12-11 DIAGNOSIS — M5117 Intervertebral disc disorders with radiculopathy, lumbosacral region: Secondary | ICD-10-CM | POA: Diagnosis not present

## 2018-12-11 DIAGNOSIS — M5116 Intervertebral disc disorders with radiculopathy, lumbar region: Secondary | ICD-10-CM | POA: Diagnosis not present

## 2019-01-08 DIAGNOSIS — M5116 Intervertebral disc disorders with radiculopathy, lumbar region: Secondary | ICD-10-CM | POA: Diagnosis not present

## 2019-01-08 DIAGNOSIS — M9905 Segmental and somatic dysfunction of pelvic region: Secondary | ICD-10-CM | POA: Diagnosis not present

## 2019-01-08 DIAGNOSIS — M9903 Segmental and somatic dysfunction of lumbar region: Secondary | ICD-10-CM | POA: Diagnosis not present

## 2019-01-08 DIAGNOSIS — M5117 Intervertebral disc disorders with radiculopathy, lumbosacral region: Secondary | ICD-10-CM | POA: Diagnosis not present

## 2019-01-20 ENCOUNTER — Ambulatory Visit (INDEPENDENT_AMBULATORY_CARE_PROVIDER_SITE_OTHER): Payer: BLUE CROSS/BLUE SHIELD | Admitting: Nurse Practitioner

## 2019-01-20 ENCOUNTER — Other Ambulatory Visit: Payer: Self-pay

## 2019-01-20 ENCOUNTER — Encounter: Payer: Self-pay | Admitting: Nurse Practitioner

## 2019-01-20 VITALS — BP 122/66 | HR 99 | Temp 98.2°F | Resp 14 | Ht 62.0 in | Wt 143.3 lb

## 2019-01-20 DIAGNOSIS — F329 Major depressive disorder, single episode, unspecified: Secondary | ICD-10-CM

## 2019-01-20 DIAGNOSIS — R7989 Other specified abnormal findings of blood chemistry: Secondary | ICD-10-CM | POA: Diagnosis not present

## 2019-01-20 DIAGNOSIS — I491 Atrial premature depolarization: Secondary | ICD-10-CM | POA: Diagnosis not present

## 2019-01-20 DIAGNOSIS — F411 Generalized anxiety disorder: Secondary | ICD-10-CM | POA: Diagnosis not present

## 2019-01-20 DIAGNOSIS — F32A Depression, unspecified: Secondary | ICD-10-CM

## 2019-01-20 NOTE — Progress Notes (Signed)
Name: Veronica Jenkins   MRN: [id number]    DOB: [birth year]   Date:01/20/2019       Progress Note  Subjective  Chief Complaint  Chief Complaint  Patient presents with  . Follow-up    HPI    Patient presents for routine follow-up on depression and anxiety. She is rx wellbutrin 150mg  daily, states this does not work well for her- she does not feel that is working. States she is unsure if she has just feel her mood is improved. States when she was on the lexapro it did work well for her but she came off of it due to decreased sexual desire. Feels she is not depressed but anxious at times. States this past week has been taking Wellbutrin every other day. States has not had a panic attack since march, has PRN clonezepam but has not needed to take it recently.    GAD 7 : Generalized Anxiety Score 01/20/2019 05/06/2017  Nervous, Anxious, on Edge 1 1  Control/stop worrying 1 1  Worry too much - different things 1 3  Trouble relaxing 2 3  Restless 1 1  Easily annoyed or irritable 3 1  Afraid - awful might happen 1 3  Total GAD 7 Score 10 13  Anxiety Difficulty Somewhat difficult Somewhat difficult    PHQ2/9: Depression screen [facility] 01/20/2019 10/14/2018 10/13/2018 05/08/2018 02/05/2018  Decreased Interest 0 0 0 0 0  Down, Depressed, Hopeless 0 0 0 0 0  PHQ - 2 Score 0 0 0 0 0  Altered sleeping 0 - - 0 0  Tired, decreased energy 0 - - 0 3  Change in appetite 0 - - 0 0  Feeling bad or failure about yourself  0 - - 0 0  Trouble concentrating 0 - - 0 0  Moving slowly or fidgety/restless 0 - - 0 0  Suicidal thoughts 0 - - 0 0  PHQ-9 Score 0 - - 0 3  Difficult doing work/chores Not difficult at all - - Not difficult at all Not difficult at all     PHQ reviewed. Negative  Patient Active Problem List   Diagnosis Date Noted  . Generalized anxiety disorder 05/08/2018  . Depression 05/08/2018  . PAC (premature atrial contraction) 02/05/2018  . Mitral regurgitation 02/05/2018  . 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 Overweight (BMI 25.0-29.9) 06/08/2015    Past Medical History:  Diagnosis Date  . Anxiety     Past Surgical History:  Procedure Laterality Date  . CESAREAN SECTION      Social History   Tobacco Use  . Smoking status: Former Smoker    Packs/day: 0.50    Years: 10.00    Pack years: 5.00    Types: Cigarettes    Last attempt to quit: 06/07/2008    Years since quitting: 10.6  . Smokeless tobacco: Never Used  Substance Use Topics  . Alcohol use: Yes    Comment: rare     Current Outpatient Medications:  .  ASHWAGANDHA PO, Take 1 tablet by mouth daily., Disp: , Rfl:  .  B Complex-Biotin-FA (B-COMPLEX PO), Take 1 tablet by mouth., Disp: , Rfl:  .  buPROPion (WELLBUTRIN XL) 150 MG 24 hr tablet, TAKE 1 TABLET BY MOUTH EVERY DAY, Disp: 90 tablet, Rfl: 1 .  ibuprofen (ADVIL,MOTRIN) 600 MG tablet, Take 1 tablet (600 mg total) by mouth every 6 (six) hours as needed., Disp: 30 tablet, Rfl: 0 .  TURMERIC PO, Take 1  tablet by mouth daily., Disp: , Rfl:   Allergies  Allergen Reactions  . Morphine Nausea Only and Nausea And Vomiting    Patient states she gets really sick    ROS   No other specific complaints in a complete review of systems (except as listed in HPI above).  Objective  Vitals:   01/20/19 1357  BP: 122/66  Pulse: 99  Resp: 14  Temp: 98.2 F (36.8 C)  TempSrc: Oral  SpO2: 99%  Weight: 143 lb 4.8 oz (65 kg)  Height: 5\' 2"  (1.575 m)     Body mass index is 26.21 kg/m.  Nursing Note and Vital Signs reviewed.  Physical Exam Constitutional:      Appearance: Normal appearance. She is well-developed.  HENT:     Head: Normocephalic and atraumatic.     Right Ear: Hearing normal.     Left Ear: Hearing normal.  Eyes:     Conjunctiva/sclera: Conjunctivae normal.  Cardiovascular:     Rate and Rhythm: Normal rate and regular rhythm.     Heart sounds: Normal heart sounds.  Pulmonary:     Effort: Pulmonary  effort is normal.     Breath sounds: Normal breath sounds.  Musculoskeletal: Normal range of motion.  Neurological:     Mental Status: She is alert and oriented to person, place, and time.  Psychiatric:        Speech: Speech normal.        Behavior: Behavior normal. Behavior is cooperative.        Thought Content: Thought content normal.        Judgment: Judgment normal.        No results found for this or any previous visit (from the past 48 hour(s)).  Assessment & Plan  1. Depression, unspecified depression type Resolved.   2. Generalized anxiety disorder Patient would like to be off medications. Will stop Wellbutrin completely discussed restarting lexapro if symptoms return- follow-up in one month or sooner if needed can trial different medication, consider trintelix or buspar  3. PAC (premature atrial contraction) stable  4. Abnormal TSH Monitor may contribute to anxiety  - TSH

## 2019-01-20 NOTE — Patient Instructions (Addendum)
-   Stop taking wellbutrin and we will follow-up in one month - If you have an increase in depressive and/or anxiety symptoms please re-start lexapro 10mg  daily and contact us.

## 2019-01-21 LAB — TSH: TSH: 3.27 mIU/L

## 2019-01-29 DIAGNOSIS — M9905 Segmental and somatic dysfunction of pelvic region: Secondary | ICD-10-CM | POA: Diagnosis not present

## 2019-01-29 DIAGNOSIS — M5117 Intervertebral disc disorders with radiculopathy, lumbosacral region: Secondary | ICD-10-CM | POA: Diagnosis not present

## 2019-01-29 DIAGNOSIS — M9903 Segmental and somatic dysfunction of lumbar region: Secondary | ICD-10-CM | POA: Diagnosis not present

## 2019-01-29 DIAGNOSIS — M5116 Intervertebral disc disorders with radiculopathy, lumbar region: Secondary | ICD-10-CM | POA: Diagnosis not present

## 2019-02-18 ENCOUNTER — Ambulatory Visit (INDEPENDENT_AMBULATORY_CARE_PROVIDER_SITE_OTHER): Payer: BC Managed Care – PPO | Admitting: Obstetrics and Gynecology

## 2019-02-18 ENCOUNTER — Telehealth: Payer: Self-pay

## 2019-02-18 ENCOUNTER — Encounter: Payer: Self-pay | Admitting: Obstetrics and Gynecology

## 2019-02-18 ENCOUNTER — Other Ambulatory Visit: Payer: Self-pay

## 2019-02-18 VITALS — BP 158/91 | HR 112 | Ht 62.0 in | Wt 143.6 lb

## 2019-02-18 DIAGNOSIS — N920 Excessive and frequent menstruation with regular cycle: Secondary | ICD-10-CM | POA: Diagnosis not present

## 2019-02-18 MED ORDER — DESOGESTREL-ETHINYL ESTRADIOL 0.15-0.02/0.01 MG (21/5) PO TABS: 1.0000 | ORAL_TABLET | Freq: Every day | ORAL | 2 refills | Status: DC

## 2019-02-18 NOTE — Progress Notes (Signed)
Patient comes in today for AUB and cervical polyp. She is a midwife patient.

## 2019-02-18 NOTE — Telephone Encounter (Signed)
Coronavirus (COVID-19) Are you at risk?  Are you at risk for the Coronavirus (COVID-19)?  To be considered HIGH RISK for Coronavirus (COVID-19), you have to meet the following criteria:  . Traveled to [place], [place], [place], [place] or [place]; or in the [place] to [place], [place], [place], or [place]; and have fever, cough, and shortness of breath within the last 2 weeks of travel OR . Been in close contact with a person diagnosed with COVID-19 within the last 2 weeks and have fever, cough, and shortness of breath . IF YOU DO NOT MEET THESE CRITERIA, YOU ARE CONSIDERED LOW RISK FOR COVID-19.  What to do if you are HIGH RISK for COVID-19?  . If you are having a medical emergency, call 911. . Seek medical care right away. Before you go to a doctor's office, urgent care or emergency department, call ahead and tell them about your recent travel, contact with someone diagnosed with COVID-19, and your symptoms. You should receive instructions from your physician's office regarding next steps of care.  . When you arrive at healthcare provider, tell the healthcare staff immediately you have returned from visiting [place], [place], [place], [place] or [place]; or traveled in the [place] to [place], [place], [place], or [place]; in the last two weeks or you have been in close contact with a person diagnosed with COVID-19 in the last 2 weeks.   . Tell the health care staff about your symptoms: fever, cough and shortness of breath. . After you have been seen by a medical provider, you will be either: o Tested for (COVID-19) and discharged home on quarantine except to seek medical care if symptoms worsen, and asked to  - Stay home and avoid contact with others until you get your results (4-5 days)  - Avoid travel on public transportation if possible (such as bus, train, or airplane) or o Sent to the Emergency Department by EMS for evaluation, COVID-19 testing, and possible  admission depending on your condition and test results.  What to do if you are LOW RISK for COVID-19?  Reduce your risk of any infection by using the same precautions used for avoiding the common cold or flu:  . Wash your hands often with soap and warm water for at least 20 seconds.  If soap and water are not readily available, use an alcohol-based hand sanitizer with at least 60% alcohol.  . If coughing or sneezing, cover your mouth and nose by coughing or sneezing into the elbow areas of your shirt or coat, into a tissue or into your sleeve (not your hands). . Avoid shaking hands with others and consider head nods or verbal greetings only. . Avoid touching your eyes, nose, or mouth with unwashed hands.  . Avoid close contact with people who are sick. . Avoid places or events with large numbers of people in one location, like concerts or sporting events. . Carefully consider travel plans you have or are making. . If you are planning any travel outside or inside the [place], visit the CDC's Travelers' Health webpage for the latest health notices. . If you have some symptoms but not all symptoms, continue to monitor at home and seek medical attention if your symptoms worsen. . If you are having a medical emergency, call 911.   ADDITIONAL HEALTHCARE OPTIONS FOR PATIENTS  [facility] / e-Visit: [email or web address]         [facility]: [phone]  [facility]: [phone]                   [facility]: [phone]   Prescreened- neg. cm  

## 2019-02-18 NOTE — Progress Notes (Signed)
HPI:      Veronica Jenkins is a 38 y.o. G2P2 who LMP was Patient's last menstrual period was 02/13/2019.  Subjective:   She presents today to discuss her regular heavy menstrual bleeding and the possibility of an endocervical endometrial polyp.  Patient complains of pelvic cramping and heavy menstrual bleeding for the first 2 to 3 days of her cycle.  Her bleeding consists of large clots.  A recent ultrasound reveals a hyperechoic area near the lower uterine segment/cervix.  Cannot be determined if this is a polyp or fibroid. Patient has specifically requested an attempt to avoid surgery if possible. Of significant note patient describes hysterectomy for fibroid uterus in her mother and another close relative.    Hx: The following portions of the patient's history were reviewed and updated as appropriate:             She  has a past medical history of Anxiety. She does not have any pertinent problems on file. She  has a past surgical history that includes Cesarean section. Her family history includes Congestive Heart Failure in her maternal grandfather; Heart disease in her maternal grandfather. She  reports that she quit smoking about 10 years ago. Her smoking use included cigarettes. She has a 5.00 pack-year smoking history. She has never used smokeless tobacco. She reports current alcohol use. She reports that she does not use drugs. She has a current medication list which includes the following prescription(s): ashwagandha, b complex-biotin-fa, bupropion, ibuprofen, and turmeric. She is allergic to morphine.       Review of Systems:  Review of Systems  Constitutional: Denied constitutional symptoms, night sweats, recent illness, fatigue, fever, insomnia and weight loss.  Eyes: Denied eye symptoms, eye pain, photophobia, vision change and visual disturbance.  Ears/Nose/Throat/Neck: Denied ear, nose, throat or neck symptoms, hearing loss, nasal discharge, sinus congestion and sore throat.   Cardiovascular: Denied cardiovascular symptoms, arrhythmia, chest pain/pressure, edema, exercise intolerance, orthopnea and palpitations.  Respiratory: Denied pulmonary symptoms, asthma, pleuritic pain, productive sputum, cough, dyspnea and wheezing.  Gastrointestinal: Denied, gastro-esophageal reflux, melena, nausea and vomiting.  Genitourinary: See HPI for additional information.  Musculoskeletal: Denied musculoskeletal symptoms, stiffness, swelling, muscle weakness and myalgia.  Dermatologic: Denied dermatology symptoms, rash and scar.  Neurologic: Denied neurology symptoms, dizziness, headache, neck pain and syncope.  Psychiatric: Denied psychiatric symptoms, anxiety and depression.  Endocrine: Denied endocrine symptoms including hot flashes and night sweats.   Meds:   Current Outpatient Medications on File Prior to Visit  Medication Sig Dispense Refill  . ASHWAGANDHA PO Take 1 tablet by mouth daily.    . B Complex-Biotin-FA (B-COMPLEX PO) Take 1 tablet by mouth.    Marland Kitchen. buPROPion (WELLBUTRIN XL) 150 MG 24 hr tablet TAKE 1 TABLET BY MOUTH EVERY DAY 90 tablet 1  . ibuprofen (ADVIL,MOTRIN) 600 MG tablet Take 1 tablet (600 mg total) by mouth every 6 (six) hours as needed. 30 tablet 0  . TURMERIC PO Take 1 tablet by mouth daily.     No current facility-administered medications on file prior to visit.     Objective:     Vitals:   02/18/19 1108  BP: (!) 158/91  Pulse: (!) 112              I have reviewed her ultrasound and it is not easy to determine whether this is a low polyp or a small cervical fibroid.  Favor polyp.  Physical examination   Pelvic:   Vulva: Normal appearance.  No lesions.  Vagina: No lesions or abnormalities noted.  Support: Normal pelvic support.  Urethra No masses tenderness or scarring.  Meatus Normal size without lesions or prolapse.  Cervix: Normal appearance.  No lesions.  By spreading the cervix with a Claiborne Billings I believe I can see the lower aspect of a  protrusion which may be a polyp.  I could not grasp this polyp as it made the patient uncomfortable.  Anus: Normal exam.  No lesions.  Perineum: Normal exam.  No lesions.        Bimanual   Uterus: Normal size.  Non-tender.  Mobile.  AV.  Adnexae: No masses.  Non-tender to palpation.  Cul-de-sac: Negative for abnormality.     Assessment:    G2P2 Patient Active Problem List   Diagnosis Date Noted  . Generalized anxiety disorder 05/08/2018  . Depression 05/08/2018  . PAC (premature atrial contraction) 02/05/2018  . Mitral regurgitation 02/05/2018  . Adolescent idiopathic scoliosis of lumbar region 09/08/2015  . Overweight (BMI 25.0-29.9) 06/08/2015     1. Menorrhagia with regular cycle     Possibly due to endocervical/endometrial polyp versus small cervical fibroid versus other.   Plan:            1.  We have discussed multiple strategies regarding management of this possible polyp and management of her vaginal clotting bleeding.  Based on appearance and regular menstrual cycles I do not have a high index of suspicion regarding cancer. After long discussion we have decided upon follow-up ultrasound to reimage this. She has also chosen to start OCPs to try to control her heavy menstrual bleeding.  Should this ultrasound show an enlarging structure would recommend D&C.  If her bleeding becomes irregular despite OCPs would again recommend D&C. Orders Orders Placed This Encounter  Procedures  . US PELVIS (TRANSABDOMINAL ONLY)    No orders of the defined types were placed in this encounter.     F/U  No follow-ups on file. I spent 29 minutes involved in the care of this patient of which greater than 50% was spent discussing differential diagnosis of cervical lesion and current and future management of menorrhagia with regular cycle.  Finis Bud, M.D. 02/18/2019 2:23 PM

## 2019-02-19 ENCOUNTER — Ambulatory Visit (INDEPENDENT_AMBULATORY_CARE_PROVIDER_SITE_OTHER): Payer: BC Managed Care – PPO

## 2019-02-19 ENCOUNTER — Other Ambulatory Visit: Payer: Self-pay | Admitting: Obstetrics and Gynecology

## 2019-02-19 DIAGNOSIS — N920 Excessive and frequent menstruation with regular cycle: Secondary | ICD-10-CM

## 2019-02-24 ENCOUNTER — Telehealth: Payer: BLUE CROSS/BLUE SHIELD | Admitting: Physician Assistant

## 2019-02-24 DIAGNOSIS — R197 Diarrhea, unspecified: Secondary | ICD-10-CM | POA: Diagnosis not present

## 2019-02-24 NOTE — Progress Notes (Signed)
We are sorry that you are not feeling well.  Here is how we plan to help!  Based on what you have shared with me it looks like you have acute diarrhea due to magnesium supplementation.  This is a common side effect of magnesium.   For your symptoms you may take Imodium 2 mg tablets that are over the counter at your local pharmacy. Take two tablet now and then one after each loose stool up to 4 tabs a day.  Antibiotics are not needed for most people with diarrhea.      HOME CARE We recommend changing your diet to help with your symptoms for the next few days. Drink plenty of fluids that contain water salt and sugar. Sports drinks such as Gatorade may help.  You may try broths, soups, bananas, applesauce, soft breads, mashed potatoes or crackers.  You are considered infectious for as long as the diarrhea continues. Hand washing or use of alcohol based hand sanitizers is recommend. It is best to stay out of work or school until your symptoms stop.   GET HELP RIGHT AWAY If you have dark yellow colored urine or do not pass urine frequently you should drink more fluids.   If your symptoms worsen  If you feel like you are going to pass out (faint) You have a new problem  MAKE SURE YOU  Understand these instructions. Will watch your condition. Will get help right away if you are not doing well or get worse.  Your e-visit answers were reviewed by a board certified advanced clinical practitioner to complete your personal care plan.  Depending on the condition, your plan could have included both over the counter or prescription medications.  If there is a problem please reply once you have received a response from your provider.  Your safety is important to [place].  If you have drug allergies check your prescription carefully.    You can use MyChart to ask questions about today's visit, request a non-urgent call back, or ask for a work or school excuse for 24 hours related to this e-Visit. If it  has been greater than 24 hours you will need to follow up with your provider, or enter a new e-Visit to address those concerns.   You will get an e-mail in the next two days asking about your experience.  I hope that your e-visit has been valuable and will speed your recovery. Thank you for using e-visits.   ===View-only below this line===   ----- Message -----    From: Veronica Jenkins    Sent: 02/24/2019 12:13 PM EDT      To: E-Visit Mailing List Subject: Diarrhea  Diarrhea --------------------------------  Question: When did the diarrhea begin? Answer:   A week ago or more  Question: How many stools have you passed in the last 24 hours? Answer:   More than eight  Question: Do you have a fever? Answer:   No, I do not have a fever  Question: Is there blood in your stool, or is your stool dark red or black? Answer:   None of the above  Question: Does your stool contain pus or mucus? Answer:   No, my stool does not contain pus or mucus  Question: Are you vomiting? Answer:   No, I am not vomiting.  Question: Do you have belly pain? Answer:   I have little or no pain  Question: Are you feeling dizzy or like you might pass out? Answer:  No  Question: Are you having trouble walking or lifting yourself due to weakness from this illness? Answer:   No  Question: Do any of the following apply to you? Answer:   My urine is normal  Question: Did the diarrhea begin after a specific meal that may have caused the illness? Answer:   Not clearly related to a meal  Question: Have you taken antibiotics recently? Answer:   I have not been on any antibiotics  Question: Have you been hospitalized in the past 2 months? Answer:   No, I have not been hospitalized recently  Question: Have you taken a laxative or a medicine to help you move your bowels lately? Answer:   No  Question: Do you work in a child care center or healthcare environment? Answer:   No  Question: Are there  people you know with similar symptoms? Answer:   No  Question: Have you had a meal consisting of raw meat or fish in the week prior to your illness? Answer:   No  Question: Have you recently travelled to a place where you may have caught an illness? Answer:   No  Question: Have you tried any medication or other treatment for your symptoms? Answer:   No  Question: Please list your medication allergies that you may have ? (If 'none' , please list as 'none') Answer:   morphine  Question: Are you pregnant? Answer:   I am confident that I am not pregnant  Question: Are you breastfeeding? Answer:   No  Question: Please list any additional comments  Answer:   I do have some minimal stomach cramping, that started today.             I started a triplex magnesium supplement 300mg /daily on June 20th.             Diarrhea started June 24th.             I stopped the magnesium on the 27th.             So I am day 7 of this. It's starts first thing in the morning when I wake up and continues all morning.            I've also had been exposed to a puppy that had confirmed tapeworms.             And I'm aware this can be a symptom of Covid-19 as well.             I tried one small dose of pepto this morning, but that did nothing.            My appetite is fine and I eat when hungry.            I have been drinking water to stay hydrated, but have low energy.            I'm not necessarily looking for medicine to fix, but want to know if I should have stool tested for a parasite or something else. Or if I should be concerned this is Covid.  A total of 5-10 minutes was spent evaluating this patients questionnaire and formulating a plan of care.

## 2019-02-26 DIAGNOSIS — Z1159 Encounter for screening for other viral diseases: Secondary | ICD-10-CM | POA: Diagnosis not present

## 2019-02-26 DIAGNOSIS — B349 Viral infection, unspecified: Secondary | ICD-10-CM | POA: Diagnosis not present

## 2019-03-02 DIAGNOSIS — F411 Generalized anxiety disorder: Secondary | ICD-10-CM | POA: Diagnosis not present

## 2019-03-09 DIAGNOSIS — F411 Generalized anxiety disorder: Secondary | ICD-10-CM | POA: Diagnosis not present

## 2019-03-10 DIAGNOSIS — S39012A Strain of muscle, fascia and tendon of lower back, initial encounter: Secondary | ICD-10-CM | POA: Diagnosis not present

## 2019-03-10 DIAGNOSIS — M9904 Segmental and somatic dysfunction of sacral region: Secondary | ICD-10-CM | POA: Diagnosis not present

## 2019-03-10 DIAGNOSIS — S161XXA Strain of muscle, fascia and tendon at neck level, initial encounter: Secondary | ICD-10-CM | POA: Diagnosis not present

## 2019-03-10 DIAGNOSIS — S29012A Strain of muscle and tendon of back wall of thorax, initial encounter: Secondary | ICD-10-CM | POA: Diagnosis not present

## 2019-03-11 ENCOUNTER — Ambulatory Visit (INDEPENDENT_AMBULATORY_CARE_PROVIDER_SITE_OTHER): Payer: BC Managed Care – PPO | Admitting: Nurse Practitioner

## 2019-03-11 ENCOUNTER — Other Ambulatory Visit: Payer: Self-pay

## 2019-03-11 ENCOUNTER — Encounter: Payer: Self-pay | Admitting: Nurse Practitioner

## 2019-03-11 VITALS — BP 127/80 | HR 87 | Temp 98.2°F

## 2019-03-11 DIAGNOSIS — F411 Generalized anxiety disorder: Secondary | ICD-10-CM | POA: Diagnosis not present

## 2019-03-11 DIAGNOSIS — F410 Panic disorder [episodic paroxysmal anxiety] without agoraphobia: Secondary | ICD-10-CM | POA: Diagnosis not present

## 2019-03-11 MED ORDER — BUSPIRONE HCL 5 MG PO TABS: 5.0000 mg | ORAL_TABLET | Freq: Three times a day (TID) | ORAL | 1 refills | Status: DC

## 2019-03-11 MED ORDER — HYDROXYZINE PAMOATE 25 MG PO CAPS: 25.0000 mg | ORAL_CAPSULE | Freq: Three times a day (TID) | ORAL | 2 refills | Status: DC | PRN

## 2019-03-11 NOTE — Patient Instructions (Addendum)
-   Continue counseling - Continue 5mg  lexapro daily.   - Add on buspar 5mg  BID, if no effect in one week will increase to buspar 10mg  BID, if noticing some improvements can titrate up to buspar 10 mg every 8 hours or buspar 15mg  every 12 hours. If there is not improvement in your symptoms. We will stop this medication and increase your lexapro to 10mg  daily.   Do not take lexapro 10mg  daily with buspar as a rare effect may be a spiked increase in serotonin and cause seratonin syndrome. Symptoms of the serotonin syndrome may include mental status changes such as irritability, altered consciousness, confusion, hallucination, and coma; autonomic dysfunction such as tachycardia,

## 2019-03-11 NOTE — Progress Notes (Signed)
Virtual Visit via Video Note  I connected with Veronica Jenkins on 03/11/19 at  2:20 PM EDT by a video enabled telemedicine application and verified that I am speaking with the correct person using two identifiers.   Staff discussed the limitations of evaluation and management by telemedicine and the availability of in person appointments. The patient expressed understanding and agreed to proceed.  Patient location: home  My location: work office Other people present:  none HPI  Patient presents for follow up on depression and anxiety has tried lexapro and wellbutrin in the past. Had some relief with the lexapro, however discontinued it due to decreased sexual desire. Wanted to come of medications completely and see how she did. Since she has been off of medications she reports that she was okay for the first 3 weeks but has been doing poorly since then. States the anxiety has increased and has started to have frequent panic attacks. Started the lexapro back at 2.5mg  lexapro 2 weeks ago and increased it to 5mg  2 days ago and hasn't noticed a difference.  Panic attacks described as wave of panic that wake her up in the middle of night and heart is pounding- blood pressure feels elevated. Does deep breathing, has been taking clonazepam with relief. States she did take metoprolol- old script a few times with relief as well but it makes her to fatigued and drained.  PHQ2/9: Depression screen [facility] 2/9 03/11/2019 01/20/2019 10/14/2018 10/13/2018 05/08/2018  Decreased Interest 1 0 0 0 0  Down, Depressed, Hopeless 0 0 0 0 0  PHQ - 2 Score 1 0 0 0 0  Altered sleeping 3 0 - - 0  Tired, decreased energy 0 0 - - 0  Change in appetite 0 0 - - 0  Feeling bad or failure about yourself  0 0 - - 0  Trouble concentrating 0 0 - - 0  Moving slowly or fidgety/restless 1 0 - - 0  Suicidal thoughts 0 0 - - 0  PHQ-9 Score 5 0 - - 0  Difficult doing work/chores Somewhat difficult Not difficult at all - - Not difficult at  all     PHQ reviewed. Positive  Patient Active Problem List   Diagnosis Date Noted  . Generalized anxiety disorder 05/08/2018  . Depression 05/08/2018  . PAC (premature atrial contraction) 02/05/2018  . Mitral regurgitation 02/05/2018  . Adolescent idiopathic scoliosis of lumbar region 09/08/2015  . Overweight (BMI 25.0-29.9) 06/08/2015    Past Medical History:  Diagnosis Date  . Anxiety     Past Surgical History:  Procedure Laterality Date  . CESAREAN SECTION      Social History   Tobacco Use  . Smoking status: Former Smoker    Packs/day: 0.50    Years: 10.00    Pack years: 5.00    Types: Cigarettes    Quit date: 06/07/2008    Years since quitting: 10.7  . Smokeless tobacco: Never Used  Substance Use Topics  . Alcohol use: Yes    Comment: rare     Current Outpatient Medications:  .  ASHWAGANDHA PO, Take 1 tablet by mouth daily., Disp: , Rfl:  .  B Complex-Biotin-FA (B-COMPLEX PO), Take 1 tablet by mouth., Disp: , Rfl:  .  clonazePAM (KLONOPIN) 0.5 MG tablet, Take 0.25 mg by mouth as needed for anxiety., Disp: , Rfl:  .  escitalopram (LEXAPRO) 5 MG tablet, Take 5 mg by mouth daily., Disp: , Rfl:  .  ibuprofen (ADVIL,MOTRIN) 600 MG tablet,  Take 1 tablet (600 mg total) by mouth every 6 (six) hours as needed., Disp: 30 tablet, Rfl: 0 .  TURMERIC PO, Take 1 tablet by mouth daily., Disp: , Rfl:  .  desogestrel-ethinyl estradiol (MIRCETTE) 0.15-0.02/0.01 MG (21/5) tablet, Take 1 tablet by mouth at bedtime. (Patient not taking: Reported on 03/11/2019), Disp: 1 Package, Rfl: 2  Allergies  Allergen Reactions  . Morphine Nausea Only and Nausea And Vomiting    Patient states she gets really sick    ROS   No other specific complaints in a complete review of systems (except as listed in HPI above).  Objective  Vitals:   03/11/19 1415  BP: 127/80  Pulse: 87  Temp: 98.2 F (36.8 C)     There is no height or weight on file to calculate BMI.  Nursing Note and  Vital Signs reviewed.  Physical Exam   Constitutional: Patient appears well-developed and well-nourished. No distress.  HENT: Head: Normocephalic and atraumatic. Cardiovascular: Normal rate Pulmonary/Chest: Effort normal  Musculoskeletal: Normal range of motion,  Neurological: alert and oriented, speech normal.  Skin: No rash noted. No erythema.  Psychiatric: Patient has a normal mood and affect. behavior is normal. Judgment and thought content normal.    Assessment & Plan  1. GAD (generalized anxiety disorder) Continue counseling, difficult to perceive if this is mainly anxiety or if there is additional depression component- will continue her 5mg  lexapro and add on buspar 5mg  BID, if no effect in one week will increase to buspar 10mg  BID, if noticing some improvements can titrate up to buspar 10 mg every 8 hours or buspar 15mg  every 12 hours. Cautioned on serotonin syndrome- will not increase lexapro further and will come off completely if anxiety is improved on buspar.  - busPIRone (BUSPAR) 5 MG tablet; Take 1 tablet (5 mg total) by mouth 3 (three) times daily.  Dispense: 90 tablet; Refill: 1  2. Panic attack - hydrOXYzine (VISTARIL) 25 MG capsule; Take 1 capsule (25 mg total) by mouth 3 (three) times daily as needed for anxiety.  Dispense: 30 capsule; Refill: 2    Follow Up Instructions:   1 month  I discussed the assessment and treatment plan with the patient. The patient was provided an opportunity to ask questions and all were answered. The patient agreed with the plan and demonstrated an understanding of the instructions.   The patient was advised to call back or seek an in-person evaluation if the symptoms worsen or if the condition fails to improve as anticipated.  I provided 18 minutes of non-face-to-face time during this encounter.   Cheryle HorsfallElizabeth E Lavonta Tillis, NP

## 2019-03-16 DIAGNOSIS — F411 Generalized anxiety disorder: Secondary | ICD-10-CM | POA: Diagnosis not present

## 2019-04-02 DIAGNOSIS — S39012A Strain of muscle, fascia and tendon of lower back, initial encounter: Secondary | ICD-10-CM | POA: Diagnosis not present

## 2019-04-02 DIAGNOSIS — S161XXA Strain of muscle, fascia and tendon at neck level, initial encounter: Secondary | ICD-10-CM | POA: Diagnosis not present

## 2019-04-02 DIAGNOSIS — S29012A Strain of muscle and tendon of back wall of thorax, initial encounter: Secondary | ICD-10-CM | POA: Diagnosis not present

## 2019-04-02 DIAGNOSIS — M9904 Segmental and somatic dysfunction of sacral region: Secondary | ICD-10-CM | POA: Diagnosis not present

## 2019-04-03 ENCOUNTER — Other Ambulatory Visit: Payer: Self-pay | Admitting: Nurse Practitioner

## 2019-04-03 DIAGNOSIS — F411 Generalized anxiety disorder: Secondary | ICD-10-CM

## 2019-04-10 ENCOUNTER — Encounter: Payer: Self-pay | Admitting: Nurse Practitioner

## 2019-04-10 ENCOUNTER — Ambulatory Visit (INDEPENDENT_AMBULATORY_CARE_PROVIDER_SITE_OTHER): Payer: BC Managed Care – PPO | Admitting: Nurse Practitioner

## 2019-04-10 VITALS — Resp 16

## 2019-04-10 DIAGNOSIS — F411 Generalized anxiety disorder: Secondary | ICD-10-CM

## 2019-04-10 MED ORDER — ESCITALOPRAM OXALATE 5 MG PO TABS: 5.0000 mg | ORAL_TABLET | Freq: Every day | ORAL | 0 refills | Status: DC

## 2019-04-10 NOTE — Progress Notes (Signed)
Virtual Visit via Video Note  I connected with Veronica Jenkins on 04/10/19 at  1:40 PM EDT by a video enabled telemedicine application and verified that I am speaking with the correct person using two identifiers.   Staff discussed the limitations of evaluation and management by telemedicine and the availability of in person appointments. The patient expressed understanding and agreed to proceed.  Patient location: home  My location: work office Other people present:  none HPI  Patient presents for one month follow-up on anxiety. Continuing lexapro 5mg  daily did not add on buspar due to concern about serotonin. States she is okay, not amazing but is feeling better. States feels she is still somewhat anxious. She paused counseling due to being so busy. Has not taken the vistril- took clonazepam when needed instead because she was not at home when she needed it and didn't want to be excessively drowsy She additionally has been taking CBD oil PRN with some relief.   GAD 7 : Generalized Anxiety Score 04/10/2019 03/11/2019 01/20/2019 05/06/2017  Nervous, Anxious, on Edge 1 3 1 1   Control/stop worrying 0 3 1 1   Worry too much - different things 1 3 1 3   Trouble relaxing 0 3 2 3   Restless 0 2 1 1   Easily annoyed or irritable 0 0 3 1  Afraid - awful might happen 0 0 1 3  Total GAD 7 Score 2 14 10 13   Anxiety Difficulty Not difficult at all Not difficult at all Somewhat difficult Somewhat difficult     PHQ2/9: Depression screen [facility] 2/9 04/10/2019 03/11/2019 01/20/2019 10/14/2018 10/13/2018  Decreased Interest 0 1 0 0 0  Down, Depressed, Hopeless 0 0 0 0 0  PHQ - 2 Score 0 1 0 0 0  Altered sleeping 0 3 0 - -  Tired, decreased energy 0 0 0 - -  Change in appetite 0 0 0 - -  Feeling bad or failure about yourself  0 0 0 - -  Trouble concentrating 0 0 0 - -  Moving slowly or fidgety/restless 0 1 0 - -  Suicidal thoughts 0 0 0 - -  PHQ-9 Score 0 5 0 - -  Difficult doing work/chores Not difficult at all  Somewhat difficult Not difficult at all - -     PHQ reviewed. Negative  Patient Active Problem List   Diagnosis Date Noted  . Generalized anxiety disorder 05/08/2018  . Depression 05/08/2018  . PAC (premature atrial contraction) 02/05/2018  . Mitral regurgitation 02/05/2018  . Adolescent idiopathic scoliosis of lumbar region 09/08/2015  . Overweight (BMI 25.0-29.9) 06/08/2015    Past Medical History:  Diagnosis Date  . Anxiety     Past Surgical History:  Procedure Laterality Date  . CESAREAN SECTION      Social History   Tobacco Use  . Smoking status: Former Smoker    Packs/day: 0.50    Years: 10.00    Pack years: 5.00    Types: Cigarettes    Quit date: 06/07/2008    Years since quitting: 10.8  . Smokeless tobacco: Never Used  Substance Use Topics  . Alcohol use: Yes    Comment: rare     Current Outpatient Medications:  .  B Complex-Biotin-FA (B-COMPLEX PO), Take 1 tablet by mouth., Disp: , Rfl:  .  clonazePAM (KLONOPIN) 0.5 MG tablet, Take 0.25 mg by mouth as needed for anxiety., Disp: , Rfl:  .  desogestrel-ethinyl estradiol (MIRCETTE) 0.15-0.02/0.01 MG (21/5) tablet, Take 1 tablet by mouth  at bedtime., Disp: 1 Package, Rfl: 2 .  escitalopram (LEXAPRO) 5 MG tablet, Take 5 mg by mouth daily., Disp: , Rfl:  .  hydrOXYzine (VISTARIL) 25 MG capsule, Take 1 capsule (25 mg total) by mouth 3 (three) times daily as needed for anxiety., Disp: 30 capsule, Rfl: 2 .  ASHWAGANDHA PO, Take 1 tablet by mouth daily., Disp: , Rfl:  .  busPIRone (BUSPAR) 5 MG tablet, TAKE 1 TABLET BY MOUTH THREE TIMES A DAY (Patient not taking: Reported on 04/10/2019), Disp: 270 tablet, Rfl: 1 .  ibuprofen (ADVIL,MOTRIN) 600 MG tablet, Take 1 tablet (600 mg total) by mouth every 6 (six) hours as needed. (Patient not taking: Reported on 04/10/2019), Disp: 30 tablet, Rfl: 0 .  TURMERIC PO, Take 1 tablet by mouth daily., Disp: , Rfl:   Allergies  Allergen Reactions  . Morphine Nausea Only and  Nausea And Vomiting    Patient states she gets really sick    ROS   No other specific complaints in a complete review of systems (except as listed in HPI above).  Objective  There were no vitals filed for this visit.   There is no height or weight on file to calculate BMI.  Nursing Note and Vital Signs reviewed.  Physical Exam   Constitutional: Patient appears well-developed and well-nourished. No distress.  HENT: Head: Normocephalic and atraumatic. Pulmonary/Chest: Effort normal  Psychiatric: Patient has a normal mood and affect. behavior is normal. Judgment and thought content normal.    Assessment & Plan  1. GAD (generalized anxiety disorder) Improved, following up with Dr. Foy GuadalajaraKapor- psychiatry at the end of September.  Does not want to switch to buspar, no sexual side effects and noted mood improvement on 5mg  of lexapro - escitalopram (LEXAPRO) 5 MG tablet; Take 1 tablet (5 mg total) by mouth daily.  Dispense: 90 tablet; Refill: 0    Follow Up Instructions:   will call to schedule physical before the end of the year.  I discussed the assessment and treatment plan with the patient. The patient was provided an opportunity to ask questions and all were answered. The patient agreed with the plan and demonstrated an understanding of the instructions.   The patient was advised to call back or seek an in-person evaluation if the symptoms worsen or if the condition fails to improve as anticipated.  I provided 11minutes of non-face-to-face time during this encounter.   Cheryle HorsfallElizabeth E Maryn Freelove, NP

## 2019-04-16 DIAGNOSIS — M5117 Intervertebral disc disorders with radiculopathy, lumbosacral region: Secondary | ICD-10-CM | POA: Diagnosis not present

## 2019-04-16 DIAGNOSIS — M9903 Segmental and somatic dysfunction of lumbar region: Secondary | ICD-10-CM | POA: Diagnosis not present

## 2019-04-16 DIAGNOSIS — M5116 Intervertebral disc disorders with radiculopathy, lumbar region: Secondary | ICD-10-CM | POA: Diagnosis not present

## 2019-04-16 DIAGNOSIS — M9905 Segmental and somatic dysfunction of pelvic region: Secondary | ICD-10-CM | POA: Diagnosis not present

## 2019-04-22 ENCOUNTER — Encounter: Payer: BC Managed Care – PPO | Admitting: Obstetrics and Gynecology

## 2019-05-05 ENCOUNTER — Other Ambulatory Visit: Payer: Self-pay | Admitting: Obstetrics and Gynecology

## 2019-05-05 DIAGNOSIS — N920 Excessive and frequent menstruation with regular cycle: Secondary | ICD-10-CM

## 2019-05-12 DIAGNOSIS — M9905 Segmental and somatic dysfunction of pelvic region: Secondary | ICD-10-CM | POA: Diagnosis not present

## 2019-05-12 DIAGNOSIS — M9903 Segmental and somatic dysfunction of lumbar region: Secondary | ICD-10-CM | POA: Diagnosis not present

## 2019-05-12 DIAGNOSIS — M5117 Intervertebral disc disorders with radiculopathy, lumbosacral region: Secondary | ICD-10-CM | POA: Diagnosis not present

## 2019-05-12 DIAGNOSIS — M5116 Intervertebral disc disorders with radiculopathy, lumbar region: Secondary | ICD-10-CM | POA: Diagnosis not present

## 2019-05-26 DIAGNOSIS — F411 Generalized anxiety disorder: Secondary | ICD-10-CM | POA: Diagnosis not present

## 2019-05-26 DIAGNOSIS — F5105 Insomnia due to other mental disorder: Secondary | ICD-10-CM | POA: Diagnosis not present

## 2019-05-27 ENCOUNTER — Encounter: Payer: Self-pay | Admitting: Family Medicine

## 2019-05-27 DIAGNOSIS — F99 Mental disorder, not otherwise specified: Secondary | ICD-10-CM | POA: Insufficient documentation

## 2019-05-27 DIAGNOSIS — F5105 Insomnia due to other mental disorder: Secondary | ICD-10-CM | POA: Insufficient documentation

## 2019-06-10 DIAGNOSIS — M9905 Segmental and somatic dysfunction of pelvic region: Secondary | ICD-10-CM | POA: Diagnosis not present

## 2019-06-10 DIAGNOSIS — M5116 Intervertebral disc disorders with radiculopathy, lumbar region: Secondary | ICD-10-CM | POA: Diagnosis not present

## 2019-06-10 DIAGNOSIS — M5117 Intervertebral disc disorders with radiculopathy, lumbosacral region: Secondary | ICD-10-CM | POA: Diagnosis not present

## 2019-06-10 DIAGNOSIS — M9903 Segmental and somatic dysfunction of lumbar region: Secondary | ICD-10-CM | POA: Diagnosis not present

## 2019-06-18 ENCOUNTER — Encounter: Payer: BLUE CROSS/BLUE SHIELD | Admitting: Obstetrics and Gynecology

## 2019-07-08 ENCOUNTER — Encounter: Payer: BC Managed Care – PPO | Admitting: Obstetrics and Gynecology

## 2019-07-08 DIAGNOSIS — M9903 Segmental and somatic dysfunction of lumbar region: Secondary | ICD-10-CM | POA: Diagnosis not present

## 2019-07-08 DIAGNOSIS — M9905 Segmental and somatic dysfunction of pelvic region: Secondary | ICD-10-CM | POA: Diagnosis not present

## 2019-07-08 DIAGNOSIS — M5117 Intervertebral disc disorders with radiculopathy, lumbosacral region: Secondary | ICD-10-CM | POA: Diagnosis not present

## 2019-07-08 DIAGNOSIS — M5116 Intervertebral disc disorders with radiculopathy, lumbar region: Secondary | ICD-10-CM | POA: Diagnosis not present

## 2019-07-09 DIAGNOSIS — F5105 Insomnia due to other mental disorder: Secondary | ICD-10-CM | POA: Diagnosis not present

## 2019-07-09 DIAGNOSIS — F411 Generalized anxiety disorder: Secondary | ICD-10-CM | POA: Diagnosis not present

## 2019-07-16 ENCOUNTER — Telehealth: Payer: Self-pay

## 2019-07-16 NOTE — Telephone Encounter (Signed)
For one month breast lump feels hard, not moveable, has gotten bigger, tender, redness around it. Pt states calling PCP for appt. Pt to check back in to update if she can be seen there and if not she desires appt here. Await pt return call.

## 2019-07-16 NOTE — Telephone Encounter (Signed)
Ivin Booty ,   Please see if Ellard Artis or cherry can see her as my scheduled it to be primary OB's and my GYN's. Amalia Hailey has seen her in the past.      Thanks,  Deneise Lever

## 2019-07-17 ENCOUNTER — Encounter: Payer: Self-pay | Admitting: Family Medicine

## 2019-07-17 ENCOUNTER — Other Ambulatory Visit: Payer: Self-pay

## 2019-07-17 ENCOUNTER — Ambulatory Visit (INDEPENDENT_AMBULATORY_CARE_PROVIDER_SITE_OTHER): Payer: BC Managed Care – PPO | Admitting: Family Medicine

## 2019-07-17 VITALS — BP 128/84 | HR 98 | Temp 97.5°F | Resp 16 | Ht 62.0 in | Wt 148.0 lb

## 2019-07-17 DIAGNOSIS — N6001 Solitary cyst of right breast: Secondary | ICD-10-CM

## 2019-07-17 DIAGNOSIS — L03319 Cellulitis of trunk, unspecified: Secondary | ICD-10-CM

## 2019-07-17 MED ORDER — DOXYCYCLINE HYCLATE 100 MG PO TABS: 100.0000 mg | ORAL_TABLET | Freq: Two times a day (BID) | ORAL | 0 refills | Status: AC

## 2019-07-17 MED ORDER — FLUCONAZOLE 150 MG PO TABS: 150.0000 mg | ORAL_TABLET | ORAL | 0 refills | Status: DC | PRN

## 2019-07-17 NOTE — Progress Notes (Signed)
Patient ID: Veronica Jenkins, female    DOB: 1981-02-25, 38 y.o.   MRN: [id number]  PCP: Arnetha Courser, MD  Chief Complaint  Patient presents with  . Breast Mass    Right Breast Onset- 1 year ago, but states in the past 2 weeks it has gotten tender, red and sore. States it is in the right breast at 9 o'clock.     Subjective:   Veronica Jenkins is a 38 y.o. female, presents to clinic with CC of the following:  HPI  Had a spot on her right breast that has been there for about a couple years, she has queezed it a few times and over the years she has expressed some white thick material, hasn't touched it for 6 months, over months it has been getting gradually bigger, particularly over the past couple months and it is also getting red and tender without any spontaneous drainage.  She denies any other lumps or bumps, denies any lymphadenopathy.    Patient Active Problem List   Diagnosis Date Noted  . 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 Generalized anxiety disorder 05/08/2018  . Depression 05/08/2018  . PAC (premature atrial contraction) 02/05/2018  . Mitral regurgitation 02/05/2018  . Adolescent idiopathic scoliosis of lumbar region 09/08/2015  . Overweight (BMI 25.0-29.9) 06/08/2015      Current Outpatient Medications:  .  ASHWAGANDHA PO, Take 1 tablet by mouth daily., Disp: , Rfl:  .  B Complex-Biotin-FA (B-COMPLEX PO), Take 1 tablet by mouth., Disp: , Rfl:  .  clonazePAM (KLONOPIN) 0.5 MG tablet, Take 0.25 mg by mouth as needed for anxiety., Disp: , Rfl:  .  hydrOXYzine (VISTARIL) 25 MG capsule, Take 1 capsule (25 mg total) by mouth 3 (three) times daily as needed for anxiety., Disp: 30 capsule, Rfl: 2 .  ibuprofen (ADVIL,MOTRIN) 600 MG tablet, Take 1 tablet (600 mg total) by mouth every 6 (six) hours as needed., Disp: 30 tablet, Rfl: 0 .  sertraline (ZOLOFT) 50 MG tablet, Take 25 mg by mouth every morning., Disp: , Rfl:  .  busPIRone (BUSPAR) 5 MG tablet, TAKE 1  TABLET BY MOUTH THREE TIMES A DAY (Patient not taking: Reported on 04/10/2019), Disp: 270 tablet, Rfl: 1 .  KARIVA 0.15-0.02/0.01 MG (21/5) tablet, TAKE 1 TABLET BY MOUTH EVERYDAY AT BEDTIME (Patient not taking: Reported on 07/17/2019), Disp: 84 tablet, Rfl: 0 .  TURMERIC PO, Take 1 tablet by mouth daily., Disp: , Rfl:    Allergies  Allergen Reactions  . Morphine Nausea Only and Nausea And Vomiting    Patient states she gets really sick     Family History  Problem Relation Age of Onset  . Congestive Heart Failure Maternal Grandfather   . Heart disease Maternal Grandfather   . Diabetes Neg Hx   . Cancer Neg Hx   . Hypertension Neg Hx   . Stroke Neg Hx   . COPD Neg Hx   . Breast cancer Neg Hx      Social History   Socioeconomic History  . Marital status: Married    Spouse name: Clint  . Number of children: 2  . Years of education: Not on file  . Highest education level: Associate degree: academic program  Occupational History  . Not on file  Social Needs  . Financial resource strain: Not hard at all  . Food insecurity    Worry: Never true    Inability: Never true  . Transportation needs  Medical: No    Non-medical: No  Tobacco Use  . Smoking status: Former Smoker    Packs/day: 0.50    Years: 10.00    Pack years: 5.00    Types: Cigarettes    Quit date: 06/07/2008    Years since quitting: 11.1  . Smokeless tobacco: Never Used  Substance and Sexual Activity  . Alcohol use: Yes    Comment: rare  . Drug use: No  . Sexual activity: Yes    Comment: husband-vasectomy  Lifestyle  . Physical activity    Days per week: 5 days    Minutes per session: 30 min  . Stress: Not at all  Relationships  . Social connections    Talks on phone: More than three times a week    Gets together: More than three times a week    Attends religious service: Never    Active member of club or organization: Yes    Attends meetings of clubs or organizations: Never    Relationship  status: Married  . Intimate partner violence    Fear of current or ex partner: No    Emotionally abused: No    Physically abused: No    Forced sexual activity: No  Other Topics Concern  . Not on file  Social History Narrative  . Not on file    I personally reviewed active problem list, medication list, allergies, family history, social history, health maintenance, notes from last encounter, lab results, imaging with the patient/caregiver today.  Review of Systems  Constitutional: Negative.   HENT: Negative.   Eyes: Negative.   Respiratory: Negative.   Cardiovascular: Negative.   Gastrointestinal: Negative.   Endocrine: Negative.   Genitourinary: Negative.   Musculoskeletal: Negative.   Skin: Negative.   Allergic/Immunologic: Negative.   Neurological: Negative.   Hematological: Negative.   Psychiatric/Behavioral: Negative.   All other systems reviewed and are negative.      Objective:   Vitals:   07/17/19 1508  BP: 128/84  Pulse: 98  Resp: 16  Temp: (!) 97.5 F (36.4 C)  TempSrc: Temporal  SpO2: 99%  Weight: 148 lb (67.1 kg)  Height: 5\' 2"  (1.575 m)    Body mass index is 27.07 kg/m.  Physical Exam Vitals signs and nursing note reviewed.  Constitutional:      Appearance: She is well-developed.  HENT:     Head: Normocephalic and atraumatic.     Nose: Nose normal.  Eyes:     General:        Right eye: No discharge.        Left eye: No discharge.     Conjunctiva/sclera: Conjunctivae normal.  Neck:     Trachea: No tracheal deviation.  Cardiovascular:     Rate and Rhythm: Normal rate and regular rhythm.  Pulmonary:     Effort: Pulmonary effort is normal. No respiratory distress.     Breath sounds: No stridor.  Chest:     Breasts:        Right: Tenderness present. No bleeding, inverted nipple or nipple discharge.      Comments: Indurated, erythematous tender non-mobile nodule to left breast at roughly 9 o'clock about 1.5 x 1.5 cm in size. Slightly  edematous and raise, small pore/open comedone adjacent to enlarged area.  No fluctuance, very tender to palpation, skin intact without any purulent drainage Musculoskeletal: Normal range of motion.  Lymphadenopathy:     Upper Body:     Right upper body: No axillary or pectoral adenopathy.  Skin:    General: Skin is warm and dry.     Findings: No rash.  Neurological:     Mental Status: She is alert.     Motor: No abnormal muscle tone.     Coordination: Coordination normal.  Psychiatric:        Behavior: Behavior normal.      Results for orders placed or performed in visit on 01/20/19  TSH  Result Value Ref Range   TSH 3.27 mIU/L        Assessment & Plan:      ICD-10-CM   1. Breast cyst, right  N60.01   2. Cellulitis of trunk, unspecified site of trunk  L03.319 doxycycline (VIBRA-TABS) 100 MG tablet    Suspect a cyst possibly a epidermal cyst or sebaceous cyst that has become inflamed, does not appear infected off to warrant drainage in clinic right now may resolve on its own with some warm compresses and possibly antibiotics.  Discussed options with the patient and she agrees to try conservative management first with antibiotics and warm compresses.  It may decrease in size but remain a small nodule we could refer her to general surgeon to do I&D or if it does not improve with antibiotics we can get a ultrasound on the soft tissues there.  She has no lymphadenopathy, skin puckering, nipple changes or discharge -fever infection or inflammation over malignancy but will follow her closely she is encouraged to follow-up if not improving in 48 to 72 hours or with any worsening Patient did ask for Diflucan with antibiotics and they were sent 10   Danelle Berry, PA-C 07/17/19 3:21 PM

## 2019-07-17 NOTE — Patient Instructions (Signed)
Cellulitis, Adult  Cellulitis is a skin infection. The infected area is often warm, red, swollen, and sore. It occurs most often in the arms and lower legs. It is very important to get treated for this condition. What are the causes? This condition is caused by bacteria. The bacteria enter through a break in the skin, such as a cut, burn, insect bite, open sore, or crack. What increases the risk? This condition is more likely to occur in people who:  Have a weak body defense system (immune system).  Have open cuts, burns, bites, or scrapes on the skin.  Are older than 39 years of age.  Have a blood sugar problem (diabetes).  Have a long-lasting (chronic) liver disease (cirrhosis) or kidney disease.  Are very overweight (obese).  Have a skin problem, such as: ? Itchy rash (eczema). ? Slow movement of blood in the veins (venous stasis). ? Fluid buildup below the skin (edema).  Have been treated with high-energy rays (radiation).  Use IV drugs. What are the signs or symptoms? Symptoms of this condition include:  Skin that is: ? Red. ? Streaking. ? Spotting. ? Swollen. ? Sore or painful when you touch it. ? Warm.  A fever.  Chills.  Blisters. How is this diagnosed? This condition is diagnosed based on:  Medical history.  Physical exam.  Blood tests.  Imaging tests. How is this treated? Treatment for this condition may include:  Medicines to treat infections or allergies.  Home care, such as: ? Rest. ? Placing cold or warm cloths (compresses) on the skin.  Hospital care, if the condition is very bad. Follow these instructions at home: Medicines  Take over-the-counter and prescription medicines only as told by your doctor.  If you were prescribed an antibiotic medicine, take it as told by your doctor. Do not stop taking it even if you start to feel better. General instructions   Drink enough fluid to keep your pee (urine) pale yellow.  Do not touch  or rub the infected area.  Raise (elevate) the infected area above the level of your heart while you are sitting or lying down.  Place cold or warm cloths on the area as told by your doctor.  Keep all follow-up visits as told by your doctor. This is important. Contact a doctor if:  You have a fever.  You do not start to get better after 1-2 days of treatment.  Your bone or joint under the infected area starts to hurt after the skin has healed.  Your infection comes back. This can happen in the same area or another area.  You have a swollen bump in the area.  You have new symptoms.  You feel ill and have muscle aches and pains. Get help right away if:  Your symptoms get worse.  You feel very sleepy.  You throw up (vomit) or have watery poop (diarrhea) for a long time.  You see red streaks coming from the area.  Your red area gets larger.  Your red area turns dark in color. These symptoms may represent a serious problem that is an emergency. Do not wait to see if the symptoms will go away. Get medical help right away. Call your local emergency services (911 in the [place] Do not drive yourself to the hospital. Summary  Cellulitis is a skin infection. The area is often warm, red, swollen, and sore.  This condition is treated with medicines, rest, and cold and warm cloths.  Take all medicines only   as told by your doctor.  Tell your doctor if symptoms do not start to get better after 1-2 days of treatment. This information is not intended to replace advice given to you by your health care provider. Make sure you discuss any questions you have with your health care provider. Document Released: 01/30/2008 Document Revised: 01/02/2018 Document Reviewed: 01/02/2018 Elsevier Patient Education  2020 Else[employer] Inc[employer]  Epidermal Cyst  An epidermal cyst is a small, painless lump under your skin. The cyst contains a grayish-white, bad-smelling substance (keratin). Do not try to pop  or open an epidermal cyst yourself. What are the causes?  A blocked hair follicle.  A hair that curls and re-enters the skin instead of growing straight out of the skin.  A blocked pore.  Irritated skin.  An injury to the skin.  Certain conditions that are passed along from parent to child (inherited).  Human papillomavirus (HPV).  Long-term sun damage to the skin. What increases the risk?  Having acne.  Being overweight.  Being 42-36 years old. What are the signs or symptoms? These cysts are usually harmless, but they can get infected. Symptoms of infection may include:  Redness.  Inflammation.  Tenderness.  Warmth.  Fever.  A grayish-white, bad-smelling substance drains from the cyst.  Pus drains from the cyst. How is this treated? In many cases, epidermal cysts go away on their own without treatment. If a cyst becomes infected, treatment may include:  Opening and draining the cyst, done by a doctor. After draining, you may need minor surgery to remove the rest of the cyst.  Antibiotic medicine.  Shots of medicines (steroids) that help to reduce inflammation.  Surgery to remove the cyst. Surgery may be done if the cyst: ? Becomes large. ? Bothers you. ? Has a chance of turning into cancer.  Do not try to open a cyst yourself. Follow these instructions at home:  Take over-the-counter and prescription medicines only as told by your doctor.  If you were prescribed an antibiotic medicine, take it it as told by your doctor. Do not stop using the antibiotic even if you start to feel better.  Keep the area around your cyst clean and dry.  Wear loose, dry clothing.  Avoid touching your cyst.  Check your cyst every day for signs of infection. Check for: ? Redness, swelling, or pain. ? Fluid or blood. ? Warmth. ? Pus or a bad smell.  Keep all follow-up visits as told by your doctor. This is important. How is this prevented?  Wear clean, dry,  clothing.  Avoid wearing tight clothing.  Keep your skin clean and dry. Take showers or baths every day. Contact a doctor if:  Your cyst has symptoms of infection.  Your condition does not improve or gets worse.  You have a cyst that looks different from other cysts you have had.  You have a fever. Get help right away if:  Redness spreads from the cyst into the area close by. Summary  An epidermal cyst is a sac made of skin tissue.  If a cyst becomes infected, treatment may include surgery to open and drain the cyst, or to remove it.  Take over-the-counter and prescription medicines only as told by your doctor.  Contact a doctor if your condition is not improving or is getting worse.  Keep all follow-up visits as told by your doctor. This is important. This information is not intended to replace advice given to you by your health care provider.  Make sure you discuss any questions you have with your health care provider. Document Released: 09/20/2004 Document Revised: 12/04/2018 Document Reviewed: 05/22/2018 Elsevier Patient Education  2020 [employer].

## 2019-07-21 ENCOUNTER — Ambulatory Visit (INDEPENDENT_AMBULATORY_CARE_PROVIDER_SITE_OTHER): Payer: BC Managed Care – PPO | Admitting: Family Medicine

## 2019-07-21 ENCOUNTER — Ambulatory Visit: Payer: Self-pay | Admitting: *Deleted

## 2019-07-21 ENCOUNTER — Other Ambulatory Visit: Payer: Self-pay

## 2019-07-21 ENCOUNTER — Encounter: Payer: Self-pay | Admitting: Family Medicine

## 2019-07-21 VITALS — BP 124/87 | HR 97 | Temp 98.0°F | Ht 62.0 in | Wt 148.0 lb

## 2019-07-21 DIAGNOSIS — R002 Palpitations: Secondary | ICD-10-CM | POA: Diagnosis not present

## 2019-07-21 DIAGNOSIS — F411 Generalized anxiety disorder: Secondary | ICD-10-CM | POA: Diagnosis not present

## 2019-07-21 DIAGNOSIS — N6311 Unspecified lump in the right breast, upper outer quadrant: Secondary | ICD-10-CM

## 2019-07-21 NOTE — Progress Notes (Signed)
Name: Veronica Jenkins   MRN: [id number]    DOB: [birth year]   Date:07/21/2019       Progress Note  Subjective  Chief Complaint  Chief Complaint  Patient presents with  . Palpitations    Onset-7 p.m. yesterday until 5 a.m. this morning.     I connected with  Veronica Jenkins  on 07/21/19 at 12:20 PM EST by a video enabled telemedicine application and verified that I am speaking with the correct person using two identifiers.  I discussed the limitations of evaluation and management by telemedicine and the availability of in person appointments. The patient expressed understanding and agreed to proceed. Staff also discussed with the patient that there may be a patient responsible charge related to this service. Patient Location: at home  Provider Location: [facility]   HPI  Palpitation: 10 days ago she started on Zoloft 50 mg to help with anxiety, she states last week she saw Delsa Grana for cellulitis and was given Doxycycline. She states that over the weekend her anxiety has been higher and she used some CBD oils ( but she has used that before)  . She could feel her heart jumping out of her chest last night, she tried to take a shower and heart rate was up to 140's, she woke up at night and heart rate was 150's.She contacted Dr. Nicolasa Ducking around 11 pm and she was advised to get tested for COVID-19 and was advised to take clonazepam to help with symptoms. She states the anxiety improved but heart rate still stayed up in the 105-115 range for the rest of the night .She had COVID-19 tested this am She has a history of palpitation in the past and saw Cardiologist last year and had Holter monitor - 48 hours and nothing alarming happened . Se has a history of PAC. Last TSH was normal in May 2020 and had CBC 09/2018 also normal   Breast tenderness/ possible cellulitis: doxy is not working, still slightly tender, red and swollen we will order diagnostic studies.    Patient Active Problem  List   Diagnosis Date Noted  . 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 Generalized anxiety disorder 05/08/2018  . Depression 05/08/2018  . PAC (premature atrial contraction) 02/05/2018  . Mitral regurgitation 02/05/2018  . Adolescent idiopathic scoliosis of lumbar region 09/08/2015  . Overweight (BMI 25.0-29.9) 06/08/2015    Past Surgical History:  Procedure Laterality Date  . CESAREAN SECTION      Family History  Problem Relation Age of Onset  . Congestive Heart Failure Maternal Grandfather   . Heart disease Maternal Grandfather   . Diabetes Neg Hx   . Cancer Neg Hx   . Hypertension Neg Hx   . Stroke Neg Hx   . COPD Neg Hx   . Breast cancer Neg Hx     Social History   Socioeconomic History  . Marital status: Married    Spouse name: Veronica Jenkins  . Number of children: 2  . Years of education: Not on file  . Highest education level: Associate degree: academic program  Occupational History  . Not on file  Social Needs  . Financial resource strain: Not hard at all  . Food insecurity    Worry: Never true    Inability: Never true  . Transportation needs    Medical: No    Non-medical: No  Tobacco Use  . Smoking status: Former Smoker    Packs/day: 0.50  Years: 10.00    Pack years: 5.00    Types: Cigarettes    Quit date: 06/07/2008    Years since quitting: 11.1  . Smokeless tobacco: Never Used  Substance and Sexual Activity  . Alcohol use: Yes    Comment: rare  . Drug use: No  . Sexual activity: Yes    Comment: husband-vasectomy  Lifestyle  . Physical activity    Days per week: 5 days    Minutes per session: 30 min  . Stress: Not at all  Relationships  . Social connections    Talks on phone: More than three times a week    Gets together: More than three times a week    Attends religious service: Never    Active member of club or organization: Yes    Attends meetings of clubs or organizations: Never    Relationship status: Married  . Intimate  partner violence    Fear of current or ex partner: No    Emotionally abused: No    Physically abused: No    Forced sexual activity: No  Other Topics Concern  . Not on file  Social History Narrative  . Not on file     Current Outpatient Medications:  .  clonazePAM (KLONOPIN) 0.5 MG tablet, Take 0.25 mg by mouth as needed for anxiety., Disp: , Rfl:  .  hydrOXYzine (VISTARIL) 25 MG capsule, Take 1 capsule (25 mg total) by mouth 3 (three) times daily as needed for anxiety., Disp: 30 capsule, Rfl: 2 .  ibuprofen (ADVIL,MOTRIN) 600 MG tablet, Take 1 tablet (600 mg total) by mouth every 6 (six) hours as needed., Disp: 30 tablet, Rfl: 0 .  sertraline (ZOLOFT) 50 MG tablet, Take 25 mg by mouth every morning., Disp: , Rfl:  .  ASHWAGANDHA PO, Take 1 tablet by mouth daily., Disp: , Rfl:  .  B Complex-Biotin-FA (B-COMPLEX PO), Take 1 tablet by mouth., Disp: , Rfl:  .  doxycycline (VIBRA-TABS) 100 MG tablet, Take 1 tablet (100 mg total) by mouth 2 (two) times daily for 7 days. (Patient not taking: Reported on 07/21/2019), Disp: 14 tablet, Rfl: 0 .  TURMERIC PO, Take 1 tablet by mouth daily., Disp: , Rfl:   Allergies  Allergen Reactions  . Morphine Nausea Only and Nausea And Vomiting    Patient states she gets really sick    I personally reviewed active problem list, medication list, allergies, family history, social history, health maintenance with the patient/caregiver today.   ROS  Ten systems reviewed and is negative except as mentioned in HPI   Objective  Virtual encounter, vitals from home  Vitals:   07/21/19 1227 07/21/19 1303  BP: (!) 141/95 124/87  Pulse: 97   Temp: 98 F (36.7 C)   SpO2: 98%     Body mass index is 27.07 kg/m.  Physical Exam  Awake, alert and oriented   PHQ2/9: Depression screen [facility] 2/9 07/21/2019 07/17/2019 04/10/2019 03/11/2019 01/20/2019  Decreased Interest 0 0 0 1 0  Down, Depressed, Hopeless 0 0 0 0 0  PHQ - 2 Score 0 0 0 1 0  Altered sleeping  1 0 0 3 0  Tired, decreased energy 1 0 0 0 0  Change in appetite 1 0 0 0 0  Feeling bad or failure about yourself  0 0 0 0 0  Trouble concentrating 0 0 0 0 0  Moving slowly or fidgety/restless 1 0 0 1 0  Suicidal thoughts 0 0 0 0 0  PHQ-9 Score 4 0 0 5 0  Difficult doing work/chores Somewhat difficult Not difficult at all Not difficult at all Somewhat difficult Not difficult at all  Some recent data might be hidden   PHQ-2/9 Result is negative.    Fall Risk: Fall Risk  07/17/2019 04/10/2019 03/11/2019 01/20/2019 10/14/2018  Falls in the past year? 0 0 0 0 0  Number falls in past yr: 0 0 0 - 0  Injury with Fall? 0 0 0 - 0     Assessment & Plan  1. Mass of upper outer quadrant of right breast  Not responding to antibiotics and present for over one year,  - MM Digital Diagnostic Bilat; Future - US BREAST LTD UNI RIGHT INC AXILLA; Future  2. Palpitation  Reviewed symptoms, doing better now, heart rate back down   3. GAD (generalized anxiety disorder)  Keep follow up with Dr, Evelene CroonKaur  I discussed the assessment and treatment plan with the patient. The patient was provided an opportunity to ask questions and all were answered. The patient agreed with the plan and demonstrated an understanding of the instructions.  The patient was advised to call back or seek an in-person evaluation if the symptoms worsen or if the condition fails to improve as anticipated.  I provided  25 minutes of non-face-to-face time during this encounter.

## 2019-07-21 NOTE — Telephone Encounter (Signed)
Veronica Jenkins if you're able to call the patient in follow-up -  I have seen some bizarre side effects from antibiotics, we can try and switch her antibiotics if she would like, specifically palpitations with doxycycline is not something I seen before, so I could not say whether or not this is a symptom of something new or medication side effect.  I think she did the right thing with taking her anxiety medication and since the symptoms persisted I would want her to have have someone take her full vital signs make sure her pulse ox is okay and do EKG, urgent care would be the easiest point of access.  Covid testing right now is very congested I do not feel that she has to be tested for Covid without any other symptoms.  If she feels faint, has chest pain, shortness of breath, swelling in her legs, feels like she might blackout I would encourage her to go to the ER for evaluation

## 2019-07-21 NOTE — Telephone Encounter (Signed)
Pt called with complaints of palpitations which started 07/20/2019 1930 until 07/21/2019 0530; she said her HR was average 105-110, and spiked up to 150; the pt says when she has panic attacks her HR is increased; she took clonazepam 0.25 mg at 0030 which "slowed her body down, but not her HR"; the pt says that she called her counselor last night, and was told that this could be a symptom of COVID, and she should be tested; she also wonders if this a side effect of the antibiotic she was prescribed; the pt also says that her counselor also advised her to contact her PCP; recommendations made per nurse triage protocol; she verbalized understanding; the pt sees Alinda Sierras, [place]; spoke with Vito Backers; and they will call the pt back; she can be contacted at [phone].  Reason for Disposition . [1] Skipped or extra beat(s) AND [2] increases with exercise or exertion  Answer Assessment - Initial Assessment Questions 1. DESCRIPTION: "Please describe your heart rate or heart beat that you are having" (e.g., fast/slow, regular/irregular, skipped or extra beats, "palpitations")     "palpitations" 2. ONSET: "When did it start?" (Minutes, hours or days)      07/20/2019 1930 3. DURATION: "How long does it last" (e.g., seconds, minutes, hours)     07/21/2019 0530 4. PATTERN "Does it come and go, or has it been constant since it started?"  "Does it get worse with exertion?"   "Are you feeling it now?"     Constant; worse when moved around; woke pt up 5. TAP: "Using your hand, can you tap out what you are feeling on a chair or table in front of you, so that I can hear?" (Note: not all patients can do this)        6. HEART RATE: "Can you tell me your heart rate?" "How many beats in 15 seconds?"  (Note: not all patients can do this)       87 7. RECURRENT SYMPTOM: "Have you ever had this before?" If so, ask: "When was the last time?" and "What happened that time?"     Yes, when having panic  attack 8. CAUSE: "What do you think is causing the palpitations?"     ?covid symptom, panic attack, side effect of antbiotic 9. CARDIAC HISTORY: "Do you have any history of heart disease?" (e.g., heart attack, angina, bypass surgery, angioplasty, arrhythmia)     no 10. OTHER SYMPTOMS: "Do you have any other symptoms?" (e.g., dizziness, chest pain, sweating, difficulty breathing)       Shakes, chills 11. PREGNANCY: "Is there any chance you are pregnant?" "When was your last menstrual period?"      No, LMP 07/21/2019  Protocols used: HEART RATE AND HEARTBEAT QUESTIONS-A-AH

## 2019-07-21 NOTE — Telephone Encounter (Signed)
Pt has already seen Dr. Ancil Boozer

## 2019-07-27 ENCOUNTER — Encounter: Payer: Self-pay | Admitting: Family Medicine

## 2019-07-28 ENCOUNTER — Encounter: Payer: Self-pay | Admitting: Family Medicine

## 2019-07-28 DIAGNOSIS — N6311 Unspecified lump in the right breast, upper outer quadrant: Secondary | ICD-10-CM

## 2019-07-31 DIAGNOSIS — F5105 Insomnia due to other mental disorder: Secondary | ICD-10-CM | POA: Diagnosis not present

## 2019-07-31 DIAGNOSIS — F411 Generalized anxiety disorder: Secondary | ICD-10-CM | POA: Diagnosis not present

## 2019-08-04 ENCOUNTER — Ambulatory Visit
Admission: RE | Admit: 2019-08-04 | Discharge: 2019-08-04 | Disposition: A | Discharge status: Home / Self Care | Payer: BC Managed Care – PPO | Source: Ambulatory Visit | Attending: Family Medicine | Admitting: Family Medicine

## 2019-08-04 ENCOUNTER — Other Ambulatory Visit: Payer: BLUE CROSS/BLUE SHIELD

## 2019-08-04 DIAGNOSIS — N6311 Unspecified lump in the right breast, upper outer quadrant: Secondary | ICD-10-CM | POA: Diagnosis not present

## 2019-08-04 DIAGNOSIS — R928 Other abnormal and inconclusive findings on diagnostic imaging of breast: Secondary | ICD-10-CM | POA: Diagnosis not present

## 2019-08-05 DIAGNOSIS — M5116 Intervertebral disc disorders with radiculopathy, lumbar region: Secondary | ICD-10-CM | POA: Diagnosis not present

## 2019-08-05 DIAGNOSIS — M5117 Intervertebral disc disorders with radiculopathy, lumbosacral region: Secondary | ICD-10-CM | POA: Diagnosis not present

## 2019-08-05 DIAGNOSIS — M9903 Segmental and somatic dysfunction of lumbar region: Secondary | ICD-10-CM | POA: Diagnosis not present

## 2019-08-05 DIAGNOSIS — M9905 Segmental and somatic dysfunction of pelvic region: Secondary | ICD-10-CM | POA: Diagnosis not present

## 2019-08-12 ENCOUNTER — Encounter: Payer: BC Managed Care – PPO | Admitting: Certified Nurse Midwife

## 2019-08-15 DIAGNOSIS — F5105 Insomnia due to other mental disorder: Secondary | ICD-10-CM | POA: Diagnosis not present

## 2019-08-15 DIAGNOSIS — F411 Generalized anxiety disorder: Secondary | ICD-10-CM | POA: Diagnosis not present

## 2019-08-17 ENCOUNTER — Encounter: Payer: BC Managed Care – PPO | Admitting: Certified Nurse Midwife

## 2019-09-02 DIAGNOSIS — M5116 Intervertebral disc disorders with radiculopathy, lumbar region: Secondary | ICD-10-CM | POA: Diagnosis not present

## 2019-09-02 DIAGNOSIS — M9903 Segmental and somatic dysfunction of lumbar region: Secondary | ICD-10-CM | POA: Diagnosis not present

## 2019-09-02 DIAGNOSIS — M5117 Intervertebral disc disorders with radiculopathy, lumbosacral region: Secondary | ICD-10-CM | POA: Diagnosis not present

## 2019-09-02 DIAGNOSIS — M9905 Segmental and somatic dysfunction of pelvic region: Secondary | ICD-10-CM | POA: Diagnosis not present

## 2019-09-10 IMAGING — CT CT ABD-PELV W/O CM
2 of 4 series · 17 of 46 positions shown, 19 images · non-contrast
Comparison: None.

CLINICAL DATA: Right lateral abdominal pain, right groin pain for 2
weeks

EXAM:
CT ABDOMEN AND PELVIS WITHOUT CONTRAST
TECHNIQUE: Multidetector CT imaging of the abdomen and pelvis was performed
following the standard protocol without IV contrast.

[Series 3: a/p w/o 5mm · axial · non-contrast · 0.77mm/px · z∈[+714,+1084]mm · 14 of 82 slices shown, 16 images]
[im 4/82  soft-tissue]
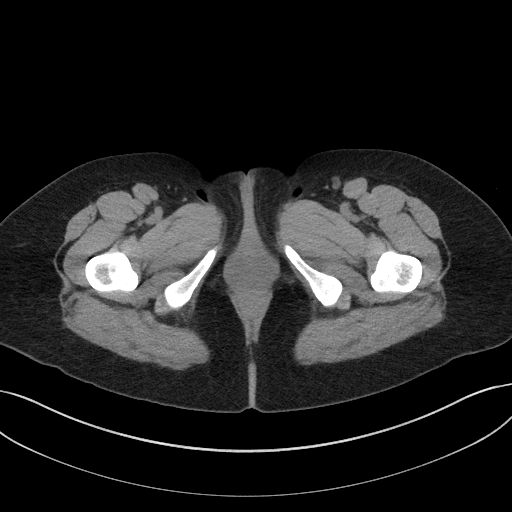
[im 4/82  bone]
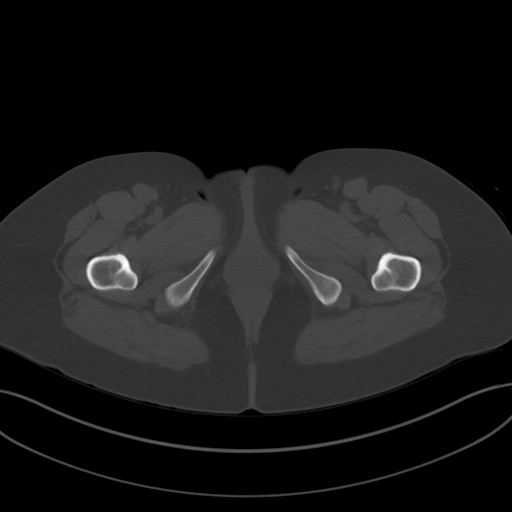
[im 11/82  soft-tissue]
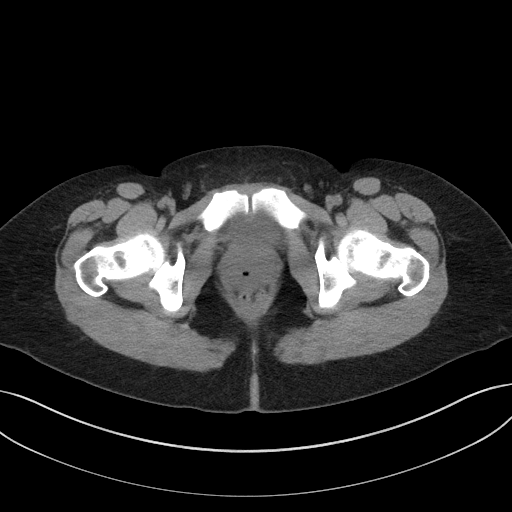
[im 17/82  soft-tissue]
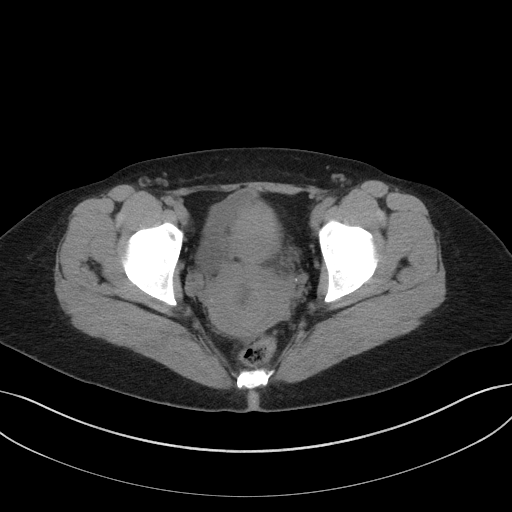
[im 21/82  soft-tissue]
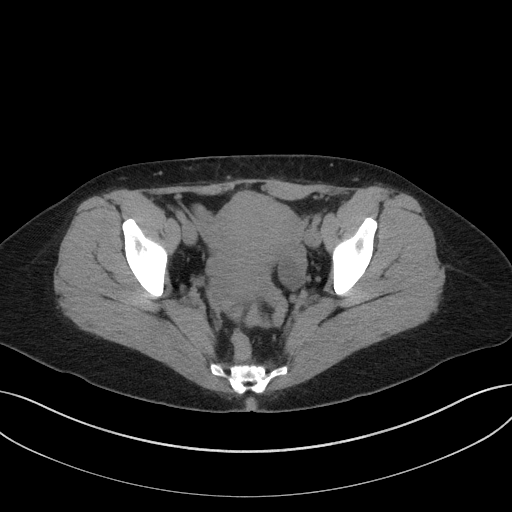
[im 28/82  soft-tissue]
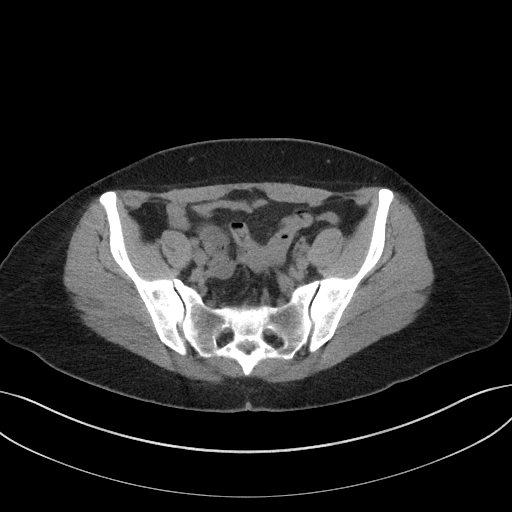
[im 34/82  soft-tissue]
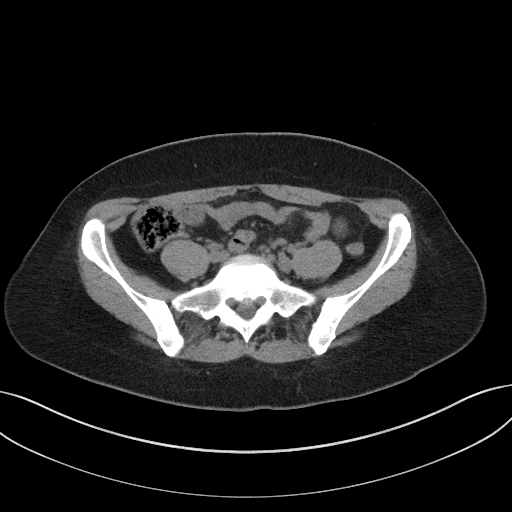
[im 38/82  soft-tissue]
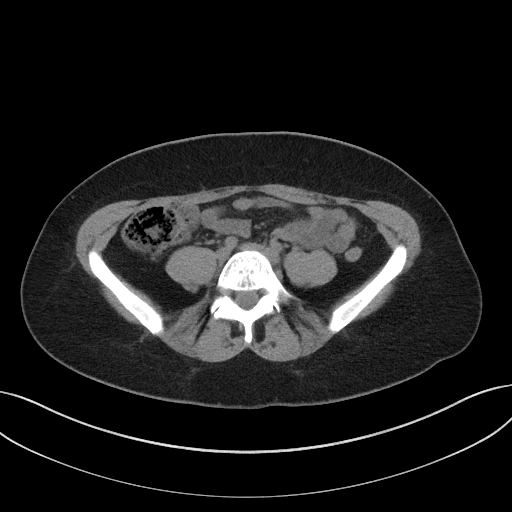
[im 44/82  soft-tissue]
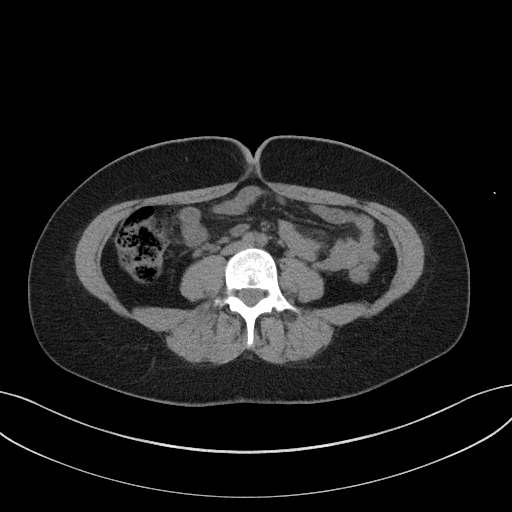
[im 48/82  soft-tissue]
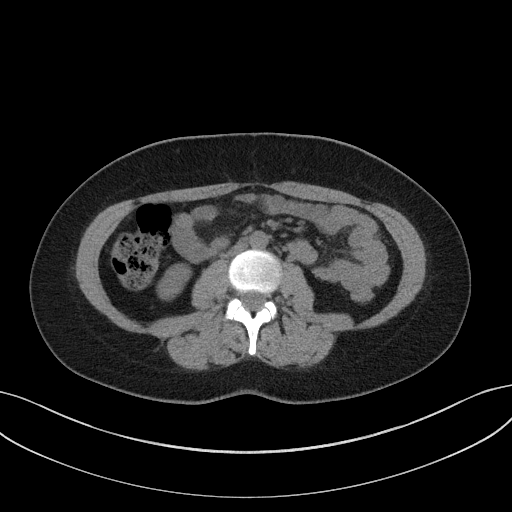
[im 48/82  bone]
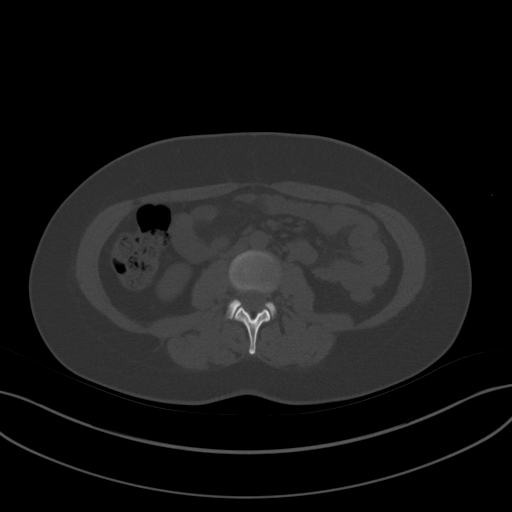
[im 55/82  soft-tissue]
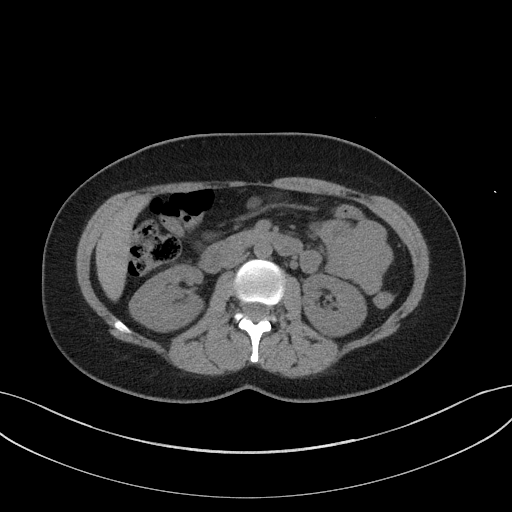
[im 61/82  soft-tissue]
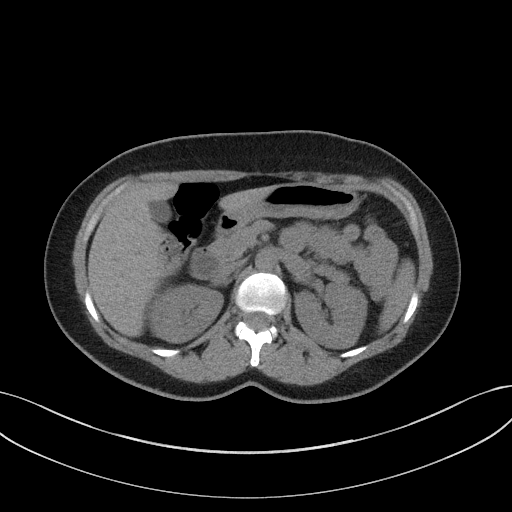
[im 65/82  soft-tissue]
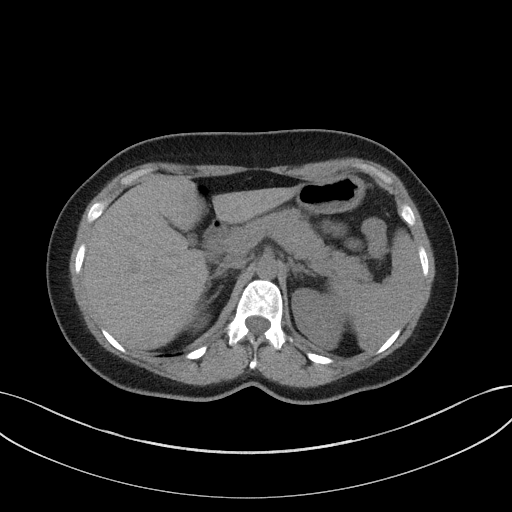
[im 71/82  soft-tissue]
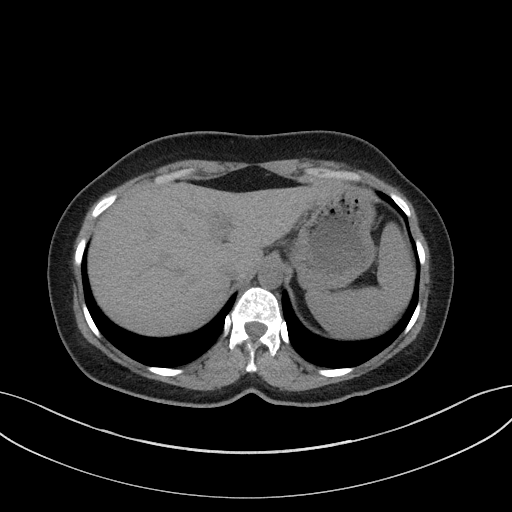
[im 78/82  soft-tissue]
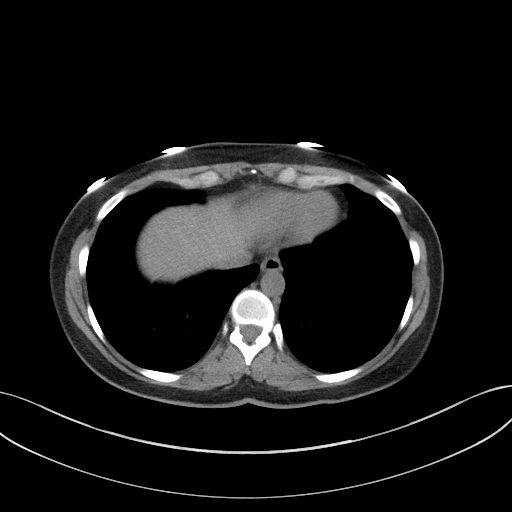

[Series 6: a/p w/o cor · coronal · non-contrast · 0.85mm/px · 3 of 145 slices shown]
[im 49/145  soft-tissue]
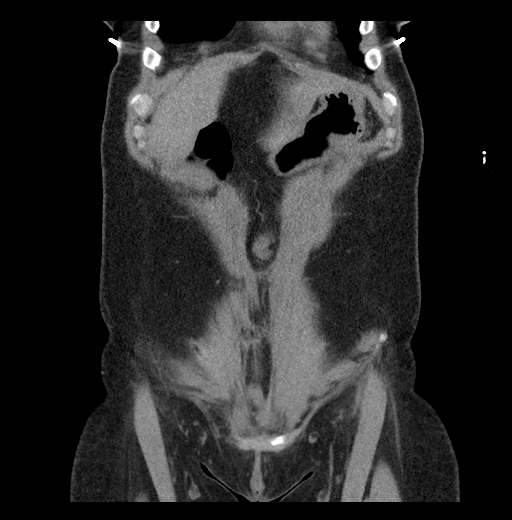
[im 65/145  soft-tissue]
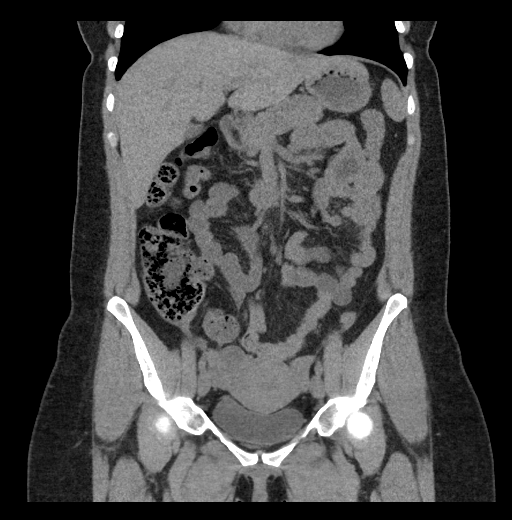
[im 81/145  soft-tissue]
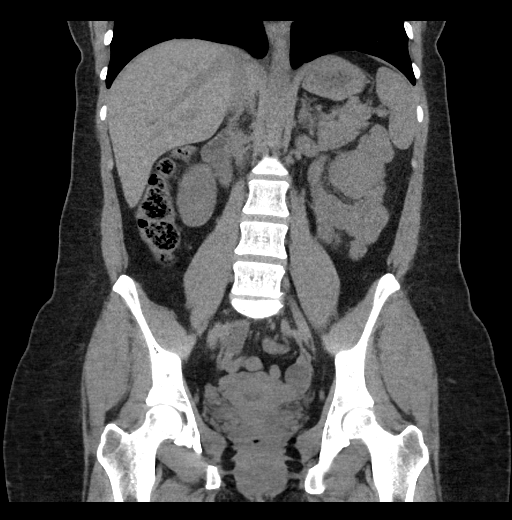

[17 of 46 positions shown; findings below may reference images not displayed]

FINDINGS: Lower chest: No acute abnormality.

Hepatobiliary: No focal liver abnormality is seen. No gallstones,
gallbladder wall thickening, or biliary dilatation.

Pancreas: Unremarkable. No pancreatic ductal dilatation or
surrounding inflammatory changes.

Spleen: Normal in size without focal abnormality.

Adrenals/Urinary Tract: Adrenal glands are unremarkable. Kidneys are
normal, without renal calculi, focal lesion, or hydronephrosis.
Bladder is unremarkable.

Stomach/Bowel: Stomach is within normal limits. Appendix appears
normal. No evidence of bowel wall thickening, distention, or
inflammatory changes.

Vascular/Lymphatic: No significant vascular findings are present. No
enlarged abdominal or pelvic lymph nodes.

Reproductive: Uterus and bilateral adnexa are unremarkable.

Other: No abdominal wall hernia or abnormality. No abdominopelvic
ascites.

Musculoskeletal: No acute osseous abnormality. No aggressive osseous
lesion. Mild osteoarthritis of bilateral sacroiliac joints.
IMPRESSION: 1. No acute abdominal or pelvic pathology.

## 2019-10-07 DIAGNOSIS — M9903 Segmental and somatic dysfunction of lumbar region: Secondary | ICD-10-CM | POA: Diagnosis not present

## 2019-10-07 DIAGNOSIS — M5116 Intervertebral disc disorders with radiculopathy, lumbar region: Secondary | ICD-10-CM | POA: Diagnosis not present

## 2019-10-07 DIAGNOSIS — M5117 Intervertebral disc disorders with radiculopathy, lumbosacral region: Secondary | ICD-10-CM | POA: Diagnosis not present

## 2019-10-07 DIAGNOSIS — M9905 Segmental and somatic dysfunction of pelvic region: Secondary | ICD-10-CM | POA: Diagnosis not present

## 2019-10-19 DIAGNOSIS — F5105 Insomnia due to other mental disorder: Secondary | ICD-10-CM | POA: Diagnosis not present

## 2019-10-19 DIAGNOSIS — F411 Generalized anxiety disorder: Secondary | ICD-10-CM | POA: Diagnosis not present

## 2019-10-26 ENCOUNTER — Encounter: Payer: Self-pay | Admitting: Family Medicine

## 2019-11-04 DIAGNOSIS — M5117 Intervertebral disc disorders with radiculopathy, lumbosacral region: Secondary | ICD-10-CM | POA: Diagnosis not present

## 2019-11-04 DIAGNOSIS — M9903 Segmental and somatic dysfunction of lumbar region: Secondary | ICD-10-CM | POA: Diagnosis not present

## 2019-11-04 DIAGNOSIS — M5116 Intervertebral disc disorders with radiculopathy, lumbar region: Secondary | ICD-10-CM | POA: Diagnosis not present

## 2019-11-04 DIAGNOSIS — M9905 Segmental and somatic dysfunction of pelvic region: Secondary | ICD-10-CM | POA: Diagnosis not present

## 2019-11-26 DIAGNOSIS — M5117 Intervertebral disc disorders with radiculopathy, lumbosacral region: Secondary | ICD-10-CM | POA: Diagnosis not present

## 2019-11-26 DIAGNOSIS — M5116 Intervertebral disc disorders with radiculopathy, lumbar region: Secondary | ICD-10-CM | POA: Diagnosis not present

## 2019-11-26 DIAGNOSIS — M9903 Segmental and somatic dysfunction of lumbar region: Secondary | ICD-10-CM | POA: Diagnosis not present

## 2019-11-26 DIAGNOSIS — M9905 Segmental and somatic dysfunction of pelvic region: Secondary | ICD-10-CM | POA: Diagnosis not present

## 2019-12-01 ENCOUNTER — Encounter: Payer: BC Managed Care – PPO | Admitting: Certified Nurse Midwife

## 2019-12-09 DIAGNOSIS — M9903 Segmental and somatic dysfunction of lumbar region: Secondary | ICD-10-CM | POA: Diagnosis not present

## 2019-12-09 DIAGNOSIS — M9905 Segmental and somatic dysfunction of pelvic region: Secondary | ICD-10-CM | POA: Diagnosis not present

## 2019-12-09 DIAGNOSIS — M5117 Intervertebral disc disorders with radiculopathy, lumbosacral region: Secondary | ICD-10-CM | POA: Diagnosis not present

## 2019-12-09 DIAGNOSIS — M5116 Intervertebral disc disorders with radiculopathy, lumbar region: Secondary | ICD-10-CM | POA: Diagnosis not present

## 2020-01-12 ENCOUNTER — Other Ambulatory Visit: Payer: Self-pay | Admitting: Obstetrics and Gynecology

## 2020-01-12 DIAGNOSIS — N921 Excessive and frequent menstruation with irregular cycle: Secondary | ICD-10-CM

## 2020-01-12 DIAGNOSIS — Z01419 Encounter for gynecological examination (general) (routine) without abnormal findings: Secondary | ICD-10-CM | POA: Diagnosis not present

## 2020-01-14 DIAGNOSIS — M5116 Intervertebral disc disorders with radiculopathy, lumbar region: Secondary | ICD-10-CM | POA: Diagnosis not present

## 2020-01-14 DIAGNOSIS — M9905 Segmental and somatic dysfunction of pelvic region: Secondary | ICD-10-CM | POA: Diagnosis not present

## 2020-01-14 DIAGNOSIS — M9903 Segmental and somatic dysfunction of lumbar region: Secondary | ICD-10-CM | POA: Diagnosis not present

## 2020-01-14 DIAGNOSIS — M5117 Intervertebral disc disorders with radiculopathy, lumbosacral region: Secondary | ICD-10-CM | POA: Diagnosis not present

## 2020-01-26 DIAGNOSIS — F5105 Insomnia due to other mental disorder: Secondary | ICD-10-CM | POA: Diagnosis not present

## 2020-01-26 DIAGNOSIS — F411 Generalized anxiety disorder: Secondary | ICD-10-CM | POA: Diagnosis not present

## 2020-02-01 ENCOUNTER — Other Ambulatory Visit: Payer: Self-pay

## 2020-02-01 ENCOUNTER — Ambulatory Visit: Payer: BC Managed Care – PPO

## 2020-02-01 ENCOUNTER — Ambulatory Visit
Admission: RE | Admit: 2020-02-01 | Discharge: 2020-02-01 | Disposition: A | Discharge status: Home / Self Care | Payer: BC Managed Care – PPO | Source: Ambulatory Visit | Attending: Obstetrics and Gynecology | Admitting: Obstetrics and Gynecology

## 2020-02-01 DIAGNOSIS — N921 Excessive and frequent menstruation with irregular cycle: Secondary | ICD-10-CM | POA: Insufficient documentation

## 2020-02-10 DIAGNOSIS — M5116 Intervertebral disc disorders with radiculopathy, lumbar region: Secondary | ICD-10-CM | POA: Diagnosis not present

## 2020-02-10 DIAGNOSIS — M5117 Intervertebral disc disorders with radiculopathy, lumbosacral region: Secondary | ICD-10-CM | POA: Diagnosis not present

## 2020-02-10 DIAGNOSIS — M9903 Segmental and somatic dysfunction of lumbar region: Secondary | ICD-10-CM | POA: Diagnosis not present

## 2020-02-10 DIAGNOSIS — M9905 Segmental and somatic dysfunction of pelvic region: Secondary | ICD-10-CM | POA: Diagnosis not present

## 2020-02-15 DIAGNOSIS — A048 Other specified bacterial intestinal infections: Secondary | ICD-10-CM | POA: Diagnosis not present

## 2020-02-15 DIAGNOSIS — E559 Vitamin D deficiency, unspecified: Secondary | ICD-10-CM | POA: Diagnosis not present

## 2020-02-15 DIAGNOSIS — Z0000 Encounter for general adult medical examination without abnormal findings: Secondary | ICD-10-CM | POA: Diagnosis not present

## 2020-02-15 DIAGNOSIS — D508 Other iron deficiency anemias: Secondary | ICD-10-CM | POA: Diagnosis not present

## 2020-03-07 DIAGNOSIS — R87610 Atypical squamous cells of undetermined significance on cytologic smear of cervix (ASC-US): Secondary | ICD-10-CM | POA: Diagnosis not present

## 2020-03-07 DIAGNOSIS — R6882 Decreased libido: Secondary | ICD-10-CM | POA: Diagnosis not present

## 2020-03-07 DIAGNOSIS — Z7189 Other specified counseling: Secondary | ICD-10-CM | POA: Diagnosis not present

## 2020-03-09 DIAGNOSIS — M5117 Intervertebral disc disorders with radiculopathy, lumbosacral region: Secondary | ICD-10-CM | POA: Diagnosis not present

## 2020-03-09 DIAGNOSIS — M9903 Segmental and somatic dysfunction of lumbar region: Secondary | ICD-10-CM | POA: Diagnosis not present

## 2020-03-09 DIAGNOSIS — M5116 Intervertebral disc disorders with radiculopathy, lumbar region: Secondary | ICD-10-CM | POA: Diagnosis not present

## 2020-03-09 DIAGNOSIS — M9905 Segmental and somatic dysfunction of pelvic region: Secondary | ICD-10-CM | POA: Diagnosis not present

## 2020-04-06 DIAGNOSIS — M9903 Segmental and somatic dysfunction of lumbar region: Secondary | ICD-10-CM | POA: Diagnosis not present

## 2020-04-06 DIAGNOSIS — M5116 Intervertebral disc disorders with radiculopathy, lumbar region: Secondary | ICD-10-CM | POA: Diagnosis not present

## 2020-04-06 DIAGNOSIS — M5117 Intervertebral disc disorders with radiculopathy, lumbosacral region: Secondary | ICD-10-CM | POA: Diagnosis not present

## 2020-04-06 DIAGNOSIS — M9905 Segmental and somatic dysfunction of pelvic region: Secondary | ICD-10-CM | POA: Diagnosis not present

## 2020-05-04 DIAGNOSIS — M9903 Segmental and somatic dysfunction of lumbar region: Secondary | ICD-10-CM | POA: Diagnosis not present

## 2020-05-04 DIAGNOSIS — M5116 Intervertebral disc disorders with radiculopathy, lumbar region: Secondary | ICD-10-CM | POA: Diagnosis not present

## 2020-05-04 DIAGNOSIS — M9905 Segmental and somatic dysfunction of pelvic region: Secondary | ICD-10-CM | POA: Diagnosis not present

## 2020-05-04 DIAGNOSIS — M5117 Intervertebral disc disorders with radiculopathy, lumbosacral region: Secondary | ICD-10-CM | POA: Diagnosis not present

## 2020-05-30 DIAGNOSIS — M5116 Intervertebral disc disorders with radiculopathy, lumbar region: Secondary | ICD-10-CM | POA: Diagnosis not present

## 2020-05-30 DIAGNOSIS — M5117 Intervertebral disc disorders with radiculopathy, lumbosacral region: Secondary | ICD-10-CM | POA: Diagnosis not present

## 2020-05-30 DIAGNOSIS — M9903 Segmental and somatic dysfunction of lumbar region: Secondary | ICD-10-CM | POA: Diagnosis not present

## 2020-05-30 DIAGNOSIS — M9905 Segmental and somatic dysfunction of pelvic region: Secondary | ICD-10-CM | POA: Diagnosis not present

## 2020-06-03 DIAGNOSIS — E559 Vitamin D deficiency, unspecified: Secondary | ICD-10-CM | POA: Diagnosis not present

## 2020-06-03 DIAGNOSIS — F419 Anxiety disorder, unspecified: Secondary | ICD-10-CM | POA: Diagnosis not present

## 2020-06-03 DIAGNOSIS — R6882 Decreased libido: Secondary | ICD-10-CM | POA: Diagnosis not present

## 2020-06-03 DIAGNOSIS — N921 Excessive and frequent menstruation with irregular cycle: Secondary | ICD-10-CM | POA: Diagnosis not present

## 2020-06-03 DIAGNOSIS — R7302 Impaired glucose tolerance (oral): Secondary | ICD-10-CM | POA: Diagnosis not present

## 2020-06-14 DIAGNOSIS — M5116 Intervertebral disc disorders with radiculopathy, lumbar region: Secondary | ICD-10-CM | POA: Diagnosis not present

## 2020-06-14 DIAGNOSIS — M5117 Intervertebral disc disorders with radiculopathy, lumbosacral region: Secondary | ICD-10-CM | POA: Diagnosis not present

## 2020-06-14 DIAGNOSIS — M9903 Segmental and somatic dysfunction of lumbar region: Secondary | ICD-10-CM | POA: Diagnosis not present

## 2020-06-14 DIAGNOSIS — M9905 Segmental and somatic dysfunction of pelvic region: Secondary | ICD-10-CM | POA: Diagnosis not present

## 2020-06-16 DIAGNOSIS — M5117 Intervertebral disc disorders with radiculopathy, lumbosacral region: Secondary | ICD-10-CM | POA: Diagnosis not present

## 2020-06-16 DIAGNOSIS — M9905 Segmental and somatic dysfunction of pelvic region: Secondary | ICD-10-CM | POA: Diagnosis not present

## 2020-06-16 DIAGNOSIS — M9903 Segmental and somatic dysfunction of lumbar region: Secondary | ICD-10-CM | POA: Diagnosis not present

## 2020-06-16 DIAGNOSIS — M5116 Intervertebral disc disorders with radiculopathy, lumbar region: Secondary | ICD-10-CM | POA: Diagnosis not present

## 2020-06-29 DIAGNOSIS — M5117 Intervertebral disc disorders with radiculopathy, lumbosacral region: Secondary | ICD-10-CM | POA: Diagnosis not present

## 2020-06-29 DIAGNOSIS — M5116 Intervertebral disc disorders with radiculopathy, lumbar region: Secondary | ICD-10-CM | POA: Diagnosis not present

## 2020-06-29 DIAGNOSIS — M9905 Segmental and somatic dysfunction of pelvic region: Secondary | ICD-10-CM | POA: Diagnosis not present

## 2020-06-29 DIAGNOSIS — M9903 Segmental and somatic dysfunction of lumbar region: Secondary | ICD-10-CM | POA: Diagnosis not present

## 2020-08-02 DIAGNOSIS — M9905 Segmental and somatic dysfunction of pelvic region: Secondary | ICD-10-CM | POA: Diagnosis not present

## 2020-08-02 DIAGNOSIS — M9903 Segmental and somatic dysfunction of lumbar region: Secondary | ICD-10-CM | POA: Diagnosis not present

## 2020-08-02 DIAGNOSIS — M5116 Intervertebral disc disorders with radiculopathy, lumbar region: Secondary | ICD-10-CM | POA: Diagnosis not present

## 2020-08-02 DIAGNOSIS — M5117 Intervertebral disc disorders with radiculopathy, lumbosacral region: Secondary | ICD-10-CM | POA: Diagnosis not present

## 2020-09-14 ENCOUNTER — Other Ambulatory Visit: Payer: Self-pay | Admitting: Family Medicine

## 2020-09-14 DIAGNOSIS — N6311 Unspecified lump in the right breast, upper outer quadrant: Secondary | ICD-10-CM

## 2020-09-22 ENCOUNTER — Other Ambulatory Visit: Payer: Self-pay | Admitting: Family Medicine

## 2020-09-22 DIAGNOSIS — N6311 Unspecified lump in the right breast, upper outer quadrant: Secondary | ICD-10-CM

## 2021-01-27 ENCOUNTER — Ambulatory Visit: Payer: Self-pay | Admitting: Podiatry

## 2021-02-03 ENCOUNTER — Encounter: Payer: Self-pay | Admitting: Podiatry

## 2021-02-03 ENCOUNTER — Ambulatory Visit (INDEPENDENT_AMBULATORY_CARE_PROVIDER_SITE_OTHER): Payer: No Typology Code available for payment source

## 2021-02-03 ENCOUNTER — Ambulatory Visit (INDEPENDENT_AMBULATORY_CARE_PROVIDER_SITE_OTHER): Payer: No Typology Code available for payment source | Admitting: Podiatry

## 2021-02-03 ENCOUNTER — Other Ambulatory Visit: Payer: Self-pay

## 2021-02-03 DIAGNOSIS — M722 Plantar fascial fibromatosis: Secondary | ICD-10-CM

## 2021-02-03 MED ORDER — MELOXICAM 15 MG PO TABS: 15.0000 mg | ORAL_TABLET | Freq: Every day | ORAL | 1 refills | Status: AC

## 2021-02-03 NOTE — Progress Notes (Signed)
   Subjective: 40 y.o. female presenting as a new patient for evaluation of bilateral heel pain this been going on intermittently for several years.  Patient states over the last month she has had an acute flareup which has been increasingly painful.  She wears good supportive shoes from [employer] running store and has tried massaging with a tennis ball and using an ice bottle.  She presents for further treatment and evaluation   Past Medical History:  Diagnosis Date   Anxiety      Objective: Physical Exam General: The patient is alert and oriented x3 in no acute distress.  Dermatology: Skin is warm, dry and supple bilateral lower extremities. Negative for open lesions or macerations bilateral.   Vascular: Dorsalis Pedis and Posterior Tibial pulses palpable bilateral.  Capillary fill time is immediate to all digits.  Neurological: Epicritic and protective threshold intact bilateral.   Musculoskeletal: Tenderness to palpation to the plantar aspect of the bilateral heels along the plantar fascia. All other joints range of motion within normal limits bilateral. Strength 5/5 in all groups bilateral.   Radiographic exam: Normal osseous mineralization. Joint spaces preserved. No fracture/dislocation/boney destruction. No other soft tissue abnormalities or radiopaque foreign bodies.   Assessment: 1. plantar fasciitis bilateral feet  Plan of Care:  1. Patient evaluated. Xrays reviewed.   2.  Patient declined steroid injections today  3.  Patient also declined a Medrol Dosepak 4. Rx for Meloxicam ordered for patient. 5. Plantar fascial band(s) dispensed for bilateral plantar fasciitis. 6. Instructed patient regarding therapies and modalities at home to alleviate symptoms.  7.  Prescription for physical therapy to take to [facility] and [place]  8.  Return to clinic in 4 weeks.    *Architectural technologist here in [employer]   Felecia Shelling, DPM [facility]  Dr. Felecia Shelling, DPM    [address] [place], [place] [address]                Office [phone]  Fax [phone]

## 2021-03-23 ENCOUNTER — Other Ambulatory Visit: Payer: Self-pay | Admitting: Advanced Practice Midwife

## 2021-03-23 DIAGNOSIS — Z1231 Encounter for screening mammogram for malignant neoplasm of breast: Secondary | ICD-10-CM

## 2021-03-28 ENCOUNTER — Other Ambulatory Visit: Payer: Self-pay

## 2021-03-28 ENCOUNTER — Ambulatory Visit
Admission: EM | Admit: 2021-03-28 | Discharge: 2021-03-28 | Disposition: A | Discharge status: Home / Self Care | Payer: No Typology Code available for payment source | Attending: Physician Assistant | Admitting: Physician Assistant

## 2021-03-28 ENCOUNTER — Ambulatory Visit: Payer: BC Managed Care – PPO

## 2021-03-28 DIAGNOSIS — M546 Pain in thoracic spine: Secondary | ICD-10-CM

## 2021-03-28 LAB — POCT URINALYSIS DIP (MANUAL ENTRY)
Bilirubin, UA: NEGATIVE
Blood, UA: NEGATIVE
Glucose, UA: NEGATIVE mg/dL
Ketones, POC UA: NEGATIVE mg/dL
Leukocytes, UA: NEGATIVE
Nitrite, UA: NEGATIVE
Protein Ur, POC: NEGATIVE mg/dL
Spec Grav, UA: 1.02 (ref 1.010–1.025)
Urobilinogen, UA: 0.2 E.U./dL
pH, UA: 5 (ref 5.0–8.0)

## 2021-03-28 MED ORDER — TIZANIDINE HCL 2 MG PO TABS: 2.0000 mg | ORAL_TABLET | Freq: Three times a day (TID) | ORAL | 0 refills | Status: AC | PRN

## 2021-03-28 NOTE — Discharge Instructions (Addendum)
Urine negative for infection, blood.   Start ibuprofen 600-800mg  three times a day, or use mobic if you still have some left Tizanidine as needed, this can make you drowsy, so do not take if you are going to drive, operate heavy machinery, or make important decisions.   Follow up with PCP/orthopedics if symptoms worsen, changes for reevaluation. If having chest pain, shortness of breath, go to the emergency department for further evaluation.

## 2021-03-28 NOTE — ED Triage Notes (Signed)
Patient presents to Urgent Care with complaints of right sided flank pain since Sunday. Pt states she noted a foul odor in her urine today.   Denies fever, hematuria, or abdominal pain.

## 2021-03-28 NOTE — ED Provider Notes (Signed)
Renaldo Fiddler    CSN: [id number] Arrival date & time: 03/28/21  1401      History   Chief Complaint Chief Complaint  Patient presents with   Back Pain    HPI VELVET MOOMAW is a 40 y.o. female.   40 year old female comes in for few day history of 3-day history of right flank pain.  Denies any injury/trauma.  Pain has been worsening since onset, now with increased pain during movement, deep breathing.  Has also noticed foul-smelling urine today.  Denies shortness of breath, cough, fever, hematuria, abdominal pain.   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 Authorizing Provider  tiZANidine (ZANAFLEX) 2 MG tablet Take 1 tablet (2 mg total) by mouth every 8 (eight) hours as needed for muscle spasms. 03/28/21  Yes Belinda Fisher, PA-C  Cholecalciferol 25 MCG (1000 UT) tablet Take by mouth.    [provider]  clonazePAM Scarlette Calico) 0.5 MG tablet Take by mouth. 09/16/20   [provider]  escitalopram (LEXAPRO) 5 MG tablet Take by mouth. 09/16/20   [provider]  ibuprofen (ADVIL,MOTRIN) 600 MG tablet Take 1 tablet (600 mg total) by mouth every 6 (six) hours as needed. 10/12/18   Domenick Gong, MD  meloxicam (MOBIC) 15 MG tablet  Take 1 tablet (15 mg total) by mouth daily. 02/03/21   Felecia Shelling, DPM    Family History Family History  Problem Relation Age of Onset   Congestive Heart Failure Maternal Grandfather    Heart disease Maternal Grandfather    Diabetes Neg Hx    Cancer Neg Hx    Hypertension Neg Hx    Stroke Neg Hx    COPD Neg Hx    Breast cancer Neg Hx     Social History Social History   Tobacco Use   Smoking status: Former    Packs/day: 0.50    Years: 10.00    Pack years: 5.00    Types: Cigarettes    Quit date: 06/07/2008    Years since quitting: 12.8   Smokeless tobacco: Never  Vaping Use   Vaping Use: Never used  Substance Use Topics   Alcohol use: Yes    Comment: rare   Drug use: No     Allergies   Morphine   Review of Systems Review of Systems  Reason unable to perform ROS: See HPI as above.    Physical Exam Triage Vital Signs ED  Triage Vitals  Enc Vitals Group     BP 03/28/21 1449 139/84     Pulse Rate 03/28/21 1449 85     Resp 03/28/21 1449 18     Temp 03/28/21 1449 99 F (37.2 C)     Temp Source 03/28/21 1449 Oral     SpO2 03/28/21 1449 97 %     Weight --      Height --      Head Circumference --      Peak Flow --      Pain Score 03/28/21 1447 5     Pain Loc --      Pain Edu? --      Excl. in GC? --    No data found.  Updated Vital Signs BP 139/84 (BP Location: Left Arm)   Pulse 85   Temp 99 F (37.2 C) (Oral)   Resp 18   LMP 03/07/2021   SpO2 97%   Physical Exam Constitutional:      General: She is not in acute distress.    Appearance: She is well-developed. She is not diaphoretic.  HENT:     Head: Normocephalic and atraumatic.  Eyes:     Conjunctiva/sclera: Conjunctivae normal.     Pupils: Pupils are equal, round, and reactive to light.  Cardiovascular:     Rate and Rhythm: Normal rate and regular rhythm.     Heart sounds: Normal heart sounds. No murmur heard.   No friction rub. No gallop.  Pulmonary:     Effort: Pulmonary effort is  normal. No accessory muscle usage or respiratory distress.     Breath sounds: Normal breath sounds. No stridor. No decreased breath sounds, wheezing, rhonchi or rales.  Musculoskeletal:     Cervical back: Normal range of motion and neck supple.     Comments: No rashes seen to the location.  No tenderness to palpation of the spinous processes.  Patient points to right thoracic back, inferior to scapula for pain.  This is not reproducible by palpation.  FROM of BUE/back, which exacerbates the pain.   Skin:    General: Skin is warm and dry.     Comments: Negative seatbelt sign  Neurological:     Mental Status: She is alert and oriented to person, place, and time. She is not disoriented.     GCS: GCS eye subscore is 4. GCS verbal subscore is 5. GCS motor subscore is 6.     Coordination: Coordination normal.     Gait: Gait normal.    UC Treatments / Results  Labs (all labs ordered are listed, but only abnormal results are displayed) Labs Reviewed  POCT URINALYSIS DIP (MANUAL ENTRY)    EKG   Radiology No results found.  Procedures Procedures (including critical care time)  Medications Ordered in UC Medications - No data to display  Initial Impression / Assessment and Plan / UC Course  I have reviewed the triage vital signs and the nursing notes.  Pertinent labs & imaging results that were available during my care of the patient were reviewed by me and considered in my medical decision making (see chart for details).    Urine dipstick negative for leuks, nitrates, blood.  Discussed current history and exam still more likely MSK in nature.  NSAIDs, muscle relaxant as needed.  Return precautions given.  Final Clinical Impressions(s) / UC Diagnoses   Final diagnoses:  Acute right-sided thoracic back pain     ED Prescriptions     Medication  Sig Dispense Auth. Provider   tiZANidine (ZANAFLEX) 2 MG tablet Take 1 tablet (2 mg total) by mouth every 8 (eight) hours as needed for  muscle spasms. 15 tablet Belinda Fisher, PA-C      PDMP not reviewed this encounter.   Belinda Fisher, PA-C 03/28/21 1520

## 2021-07-19 IMAGING — MG DIGITAL DIAGNOSTIC BILAT W/ TOMO W/ CAD
6 of 10 series · 6 of 30 positions shown · non-contrast
Comparison: Prior exams.

CLINICAL DATA: 38-year-old female presenting for evaluation of a
palpable lump on the upper-outer quadrant of the right breast which
has been present for about a year, but recently became larger, more
tender and associated with skin discoloration. A one point, the
patient states her husband was able to express some material from
it.

EXAM:
DIGITAL DIAGNOSTIC BILATERAL MAMMOGRAM WITH CAD AND TOMO

[R MLO synth-2D]
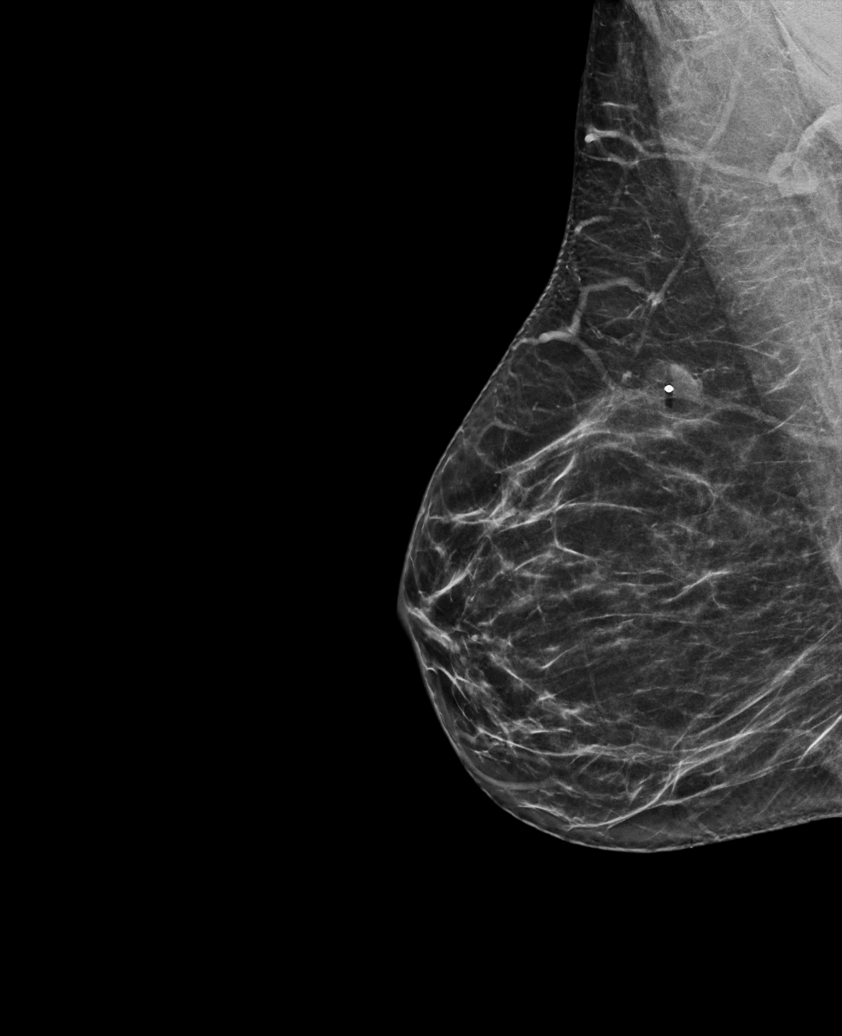

[L CC synth-2D]
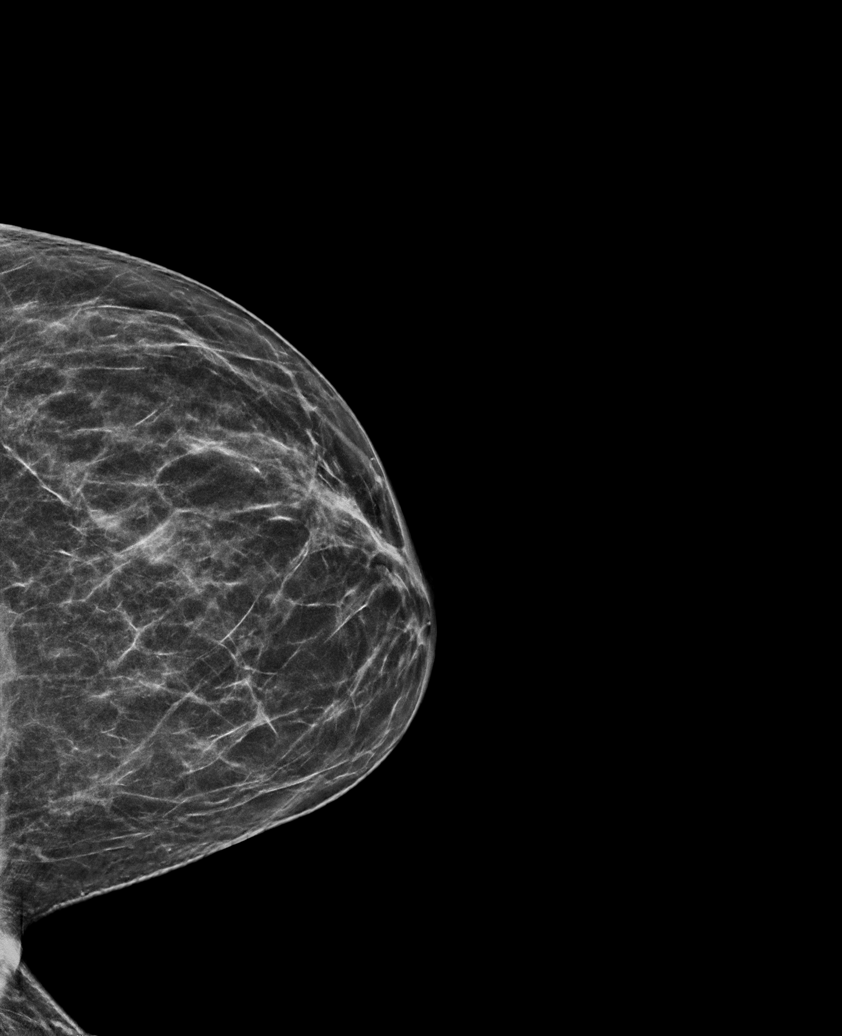

[L MLO synth-2D]
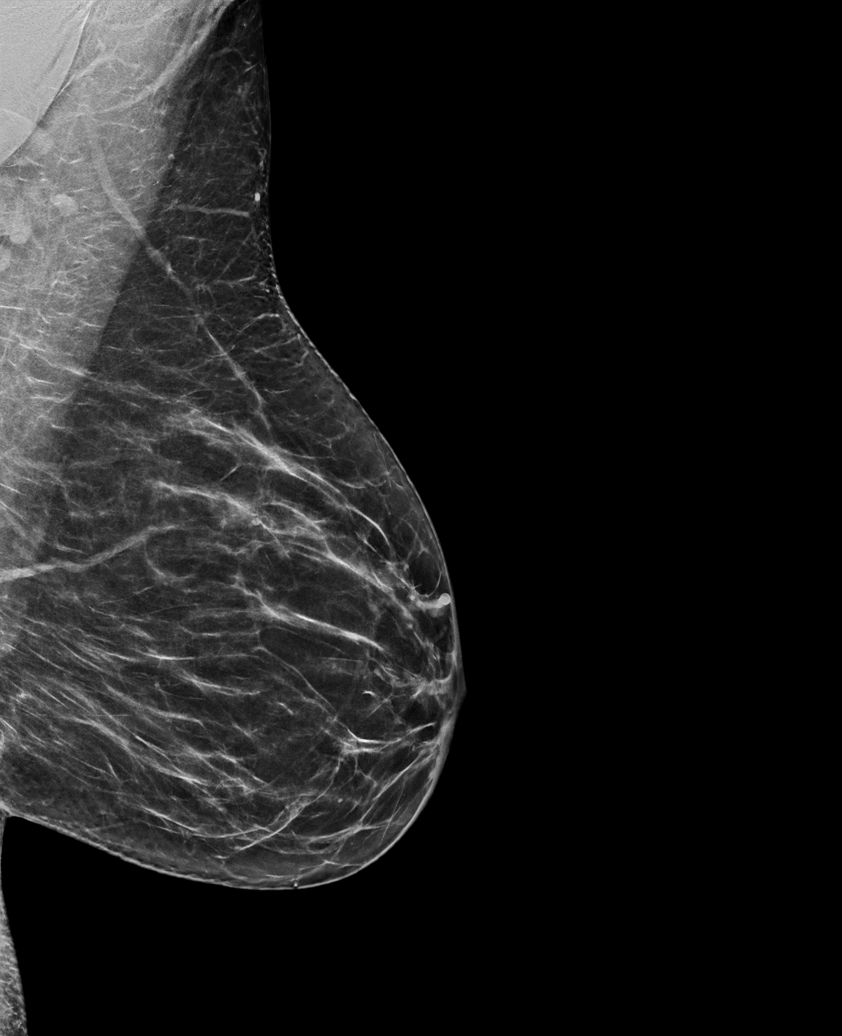

[R CC synth-2D (1 of 2)]
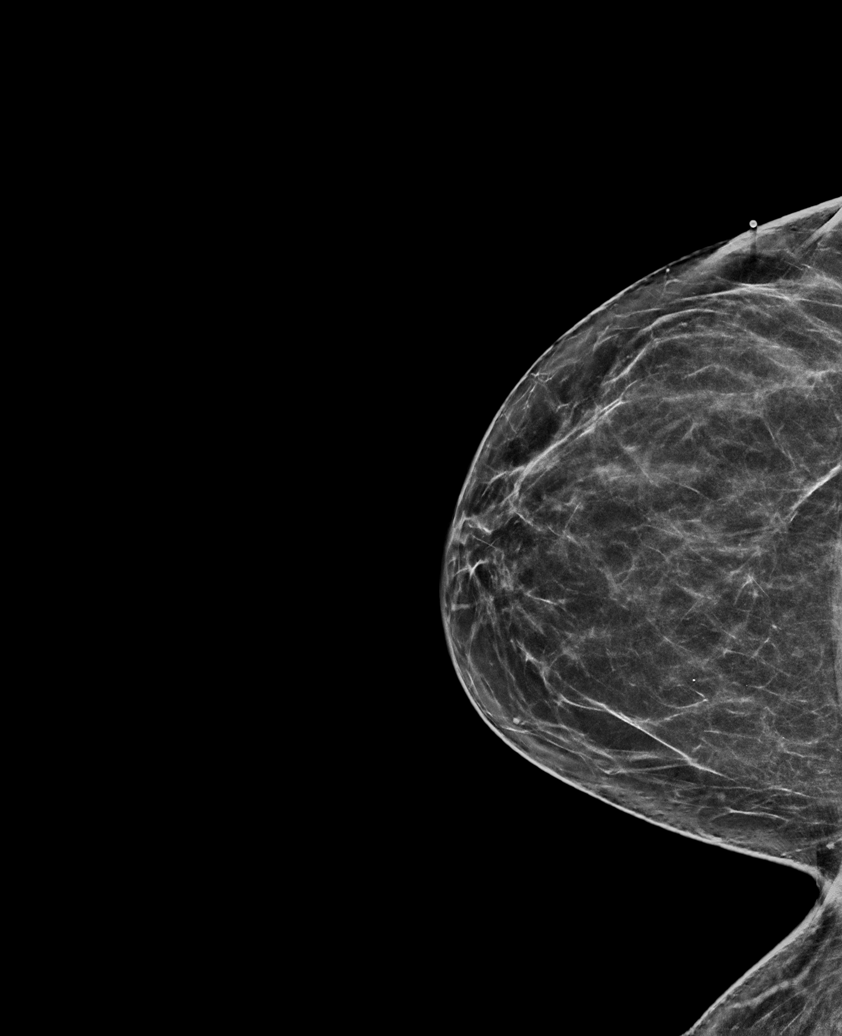

[R CC synth-2D (2 of 2)]
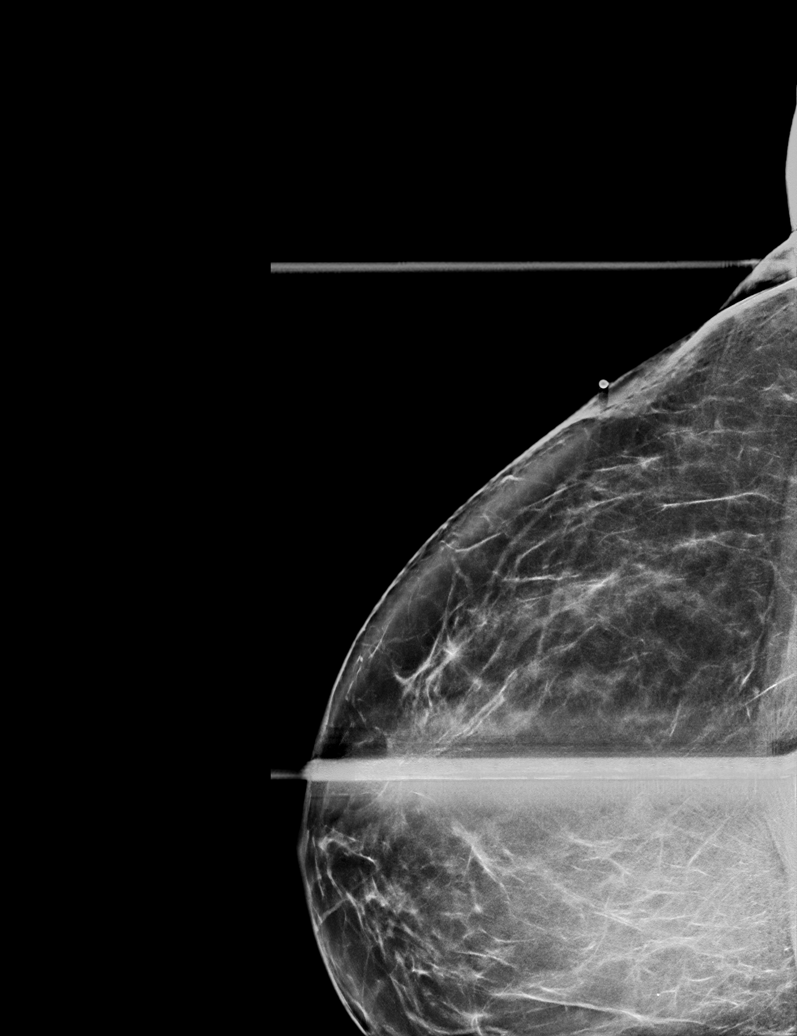

[R MLO tomo · tomo slice 31/62.0]
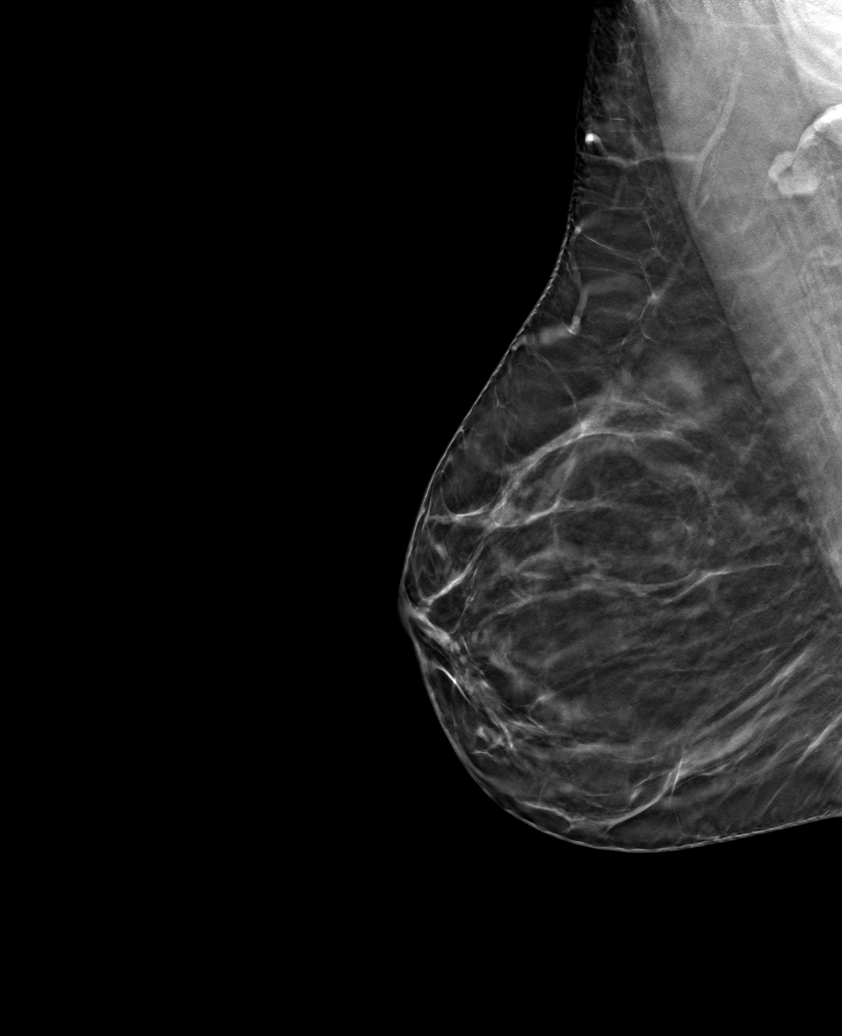

[6 of 30 positions shown; findings below may reference images not displayed]

ACR Breast Density Category b: There are scattered areas of
fibroglandular density.
FINDINGS: A BB indicating the palpable site of concern has been placed on the
palpable site in the upper-outer quadrant of the right breast. A
spot compression tomosynthesis tangential image at the palpable site
demonstrates a superficial mass with up obtuse margins with the
skin. The mass measures approximately 2.4 cm. No suspicious
calcifications, masses or areas of distortion are seen in the
bilateral breasts.

Mammographic images were processed with CAD.

Physical exam of the palpable site demonstrates a superficial
palpable lump with a central punctum.
IMPRESSION: 1. The palpable lump in the upper-outer right breast corresponds
with a benign sebaceous cyst.

2.  No mammographic evidence of malignancy in the bilateral breasts.

RECOMMENDATION:
Screening mammogram in one year.(Code:LY-[id number])

I have discussed the findings and recommendations with the patient.
If applicable, a reminder letter will be sent to the patient
regarding the next appointment.

BI-RADS CATEGORY  2: Benign.

## 2021-08-11 ENCOUNTER — Telehealth: Payer: No Typology Code available for payment source | Admitting: Family

## 2021-08-11 DIAGNOSIS — J019 Acute sinusitis, unspecified: Secondary | ICD-10-CM

## 2021-08-11 MED ORDER — AMOXICILLIN-POT CLAVULANATE 875-125 MG PO TABS: 1.0000 | ORAL_TABLET | Freq: Two times a day (BID) | ORAL | 0 refills | Status: DC

## 2021-08-11 NOTE — Progress Notes (Signed)

## 2022-06-06 ENCOUNTER — Telehealth: Payer: No Typology Code available for payment source | Admitting: Physician Assistant

## 2022-06-06 DIAGNOSIS — B9689 Other specified bacterial agents as the cause of diseases classified elsewhere: Secondary | ICD-10-CM | POA: Diagnosis not present

## 2022-06-06 DIAGNOSIS — J019 Acute sinusitis, unspecified: Secondary | ICD-10-CM

## 2022-06-06 MED ORDER — AMOXICILLIN-POT CLAVULANATE 875-125 MG PO TABS: 1.0000 | ORAL_TABLET | Freq: Two times a day (BID) | ORAL | 0 refills | Status: DC

## 2022-06-06 NOTE — Progress Notes (Signed)

## 2023-04-25 ENCOUNTER — Other Ambulatory Visit: Payer: Self-pay | Admitting: Nurse Practitioner

## 2023-04-25 DIAGNOSIS — Z1231 Encounter for screening mammogram for malignant neoplasm of breast: Secondary | ICD-10-CM

## 2023-05-04 ENCOUNTER — Ambulatory Visit
Admission: RE | Admit: 2023-05-04 | Discharge: 2023-05-04 | Disposition: A | Discharge status: Home / Self Care | Payer: No Typology Code available for payment source | Source: Ambulatory Visit | Attending: Family Medicine | Admitting: Family Medicine

## 2023-05-04 VITALS — BP 164/92 | HR 91 | Temp 98.6°F | Resp 14 | Ht 62.0 in | Wt 147.9 lb

## 2023-05-04 DIAGNOSIS — U071 COVID-19: Secondary | ICD-10-CM

## 2023-05-04 LAB — SARS CORONAVIRUS 2 BY RT PCR: SARS Coronavirus 2 by RT PCR: POSITIVE — AB

## 2023-05-04 LAB — GROUP A STREP BY PCR: Group A Strep by PCR: NOT DETECTED

## 2023-05-04 MED ORDER — PROMETHAZINE-DM 6.25-15 MG/5ML PO SYRP: 5.0000 mL | ORAL_SOLUTION | Freq: Four times a day (QID) | ORAL | 0 refills | Status: AC | PRN

## 2023-05-04 MED ORDER — BENZONATATE 100 MG PO CAPS: 100.0000 mg | ORAL_CAPSULE | Freq: Three times a day (TID) | ORAL | 0 refills | Status: AC

## 2023-05-04 NOTE — ED Triage Notes (Signed)
Patient reports sore throat, HAs, bodyaches, nasal congestion and cough that started on Thursday.  Patient reports fevers.

## 2023-05-04 NOTE — Discharge Instructions (Signed)
Your strep test is negative however your test for COVID-19 was positive, meaning that you were infected with the novel coronavirus and could give the germ to others.  The recommendations suggest returning to normal activities when, for at least 24 hours, symptoms are improving overall, and if a fever was present, it has been gone without use of a fever-reducing medication.  You should wear a mask for the next 5 days to prevent the spread of disease. Please continue good preventive care measures, including:  frequent hand-washing, avoid touching your face, cover coughs/sneezes, stay out of crowds and keep a 6 foot distance from others.  Go to the nearest hospital emergency room if fever/cough/breathlessness are severe or illness seems like a threat to life.  If your were prescribed medication. Stop by the pharmacy to pick it up. You can take Tylenol and/or Ibuprofen as needed for fever reduction and pain relief.    For cough: honey 1/2 to 1 teaspoon (you can dilute the honey in water or another fluid).  You can also use guaifenesin and dextromethorphan for cough. You can use a humidifier for chest congestion and cough.  If you don't have a humidifier, you can sit in the bathroom with the hot shower running.      For sore throat: try warm salt water gargles, Mucinex sore throat cough drops or cepacol lozenges, throat spray, warm tea or water with lemon/honey, popsicles or ice, or OTC cold relief medicine for throat discomfort. You can also purchase chloraseptic spray at the pharmacy or dollar store.   For congestion: take a daily anti-histamine like Zyrtec, Claritin, and a oral decongestant, such as pseudoephedrine.  You can also use Flonase 1-2 sprays in each nostril daily. Afrin is also a good option, if you do not have high blood pressure.    It is important to stay hydrated: drink plenty of fluids (water, gatorade/powerade/pedialyte, juices, or teas) to keep your throat moisturized and help further  relieve irritation/discomfort.    Return or go to the Emergency Department if symptoms worsen or do not improve in the next few days

## 2023-05-04 NOTE — ED Provider Notes (Signed)
[facility]    CSN: [id number] Arrival date & time: 05/04/23  1237      History   Chief Complaint Chief Complaint  Patient presents with   Sore Throat    Appointment    HPI Veronica Jenkins is a 42 y.o. female.   HPI  History obtained from the patient. Veronica Jenkins presents for sore throat, facial pain, nasal congestion, fevers, chills, body aches and cough that started on Thursday. No known sick contacts. Took Advil and tylenol but none today. Tmax 101 F on Thursday.      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 Authorizing Provider  benzonatate (TESSALON) 100 MG capsule Take 1 capsule (100 mg total) by mouth every 8 (eight) hours. 05/04/23  Yes Sherma Vanmetre, Seward Meth, DO  escitalopram (LEXAPRO) 5 MG tablet Take by mouth. 09/16/20  Yes [provider]  promethazine-dextromethorphan (PROMETHAZINE-DM) 6.25-15 MG/5ML syrup Take 5 mLs by mouth 4 (four) times daily as needed. 05/04/23  Yes Briahna Pescador, Seward Meth, DO  Cholecalciferol 25 MCG (1000 UT) tablet Take by mouth.    [provider]  clonazePAM Scarlette Calico) 0.5 MG tablet Take by mouth. 09/16/20   [provider]  ibuprofen (ADVIL,MOTRIN) 600 MG tablet Take 1 tablet (600  mg total) by mouth every 6 (six) hours as needed. 10/12/18   Domenick Gong, MD  meloxicam (MOBIC) 15 MG tablet Take 1 tablet (15 mg total) by mouth daily. 02/03/21   Felecia Shelling, DPM  tiZANidine (ZANAFLEX) 2 MG tablet Take 1 tablet (2 mg total) by mouth every 8 (eight) hours as needed for muscle spasms. 03/28/21   Belinda Fisher, PA-C    Family History Family History  Problem Relation Age of Onset   Congestive Heart Failure Maternal Grandfather    Heart disease Maternal Grandfather    Diabetes Neg Hx    Cancer Neg Hx    Hypertension Neg Hx    Stroke Neg Hx    COPD Neg Hx    Breast cancer Neg Hx     Social History Social History   Tobacco Use   Smoking status: Former    Current packs/day: 0.00    Average packs/day: 0.5 packs/day for 10.0 years (5.0 ttl pk-yrs)    Types: Cigarettes    Start date: 06/07/1998    Quit date: 06/07/2008    Years since quitting: 14.9   Smokeless tobacco: Never  Vaping Use  Vaping status: Never Used  Substance Use Topics   Alcohol use: Yes    Comment: rare   Drug use: No     Allergies   Morphine   Review of Systems Review of Systems: negative unless otherwise stated in HPI.      Physical Exam Triage Vital Signs ED Triage Vitals  Encounter Vitals Group     BP 05/04/23 1254 (!) 164/92     Systolic BP Percentile --      Diastolic BP Percentile --      Pulse Rate 05/04/23 1254 91     Resp 05/04/23 1254 14     Temp 05/04/23 1254 98.6 F (37 C)     Temp Source 05/04/23 1254 Oral     SpO2 05/04/23 1254 95 %     Weight 05/04/23 1253 147 lb 14.9 oz (67.1 kg)     Height 05/04/23 1253 5\' 2"  (1.575 m)     Head Circumference --      Peak Flow --      Pain Score 05/04/23 1252 4     Pain Loc --      Pain Education --      Exclude from Growth Chart --    No data found.  Updated Vital Signs BP (!) 164/92 (BP Location: Left Arm)   Pulse 91   Temp 98.6 F (37 C) (Oral)   Resp 14   Ht 5\' 2"  (1.575 m)   Wt 67.1 kg   LMP 05/04/2023    SpO2 95%   BMI 27.06 kg/m   Visual Acuity Right Eye Distance:   Left Eye Distance:   Bilateral Distance:    Right Eye Near:   Left Eye Near:    Bilateral Near:     Physical Exam GEN:     alert, ill but non-toxic appearing female in no distress    HENT:  mucus membranes moist, oropharyngeal without lesions, moderate erythema, no tonsillar hypertrophy or exudates,  moderate erythematous edematous turbinates, clear nasal discharge, bilateral TM normal EYES:   pupils equal and reactive, no scleral injection or discharge NECK:  normal ROM, lymphadenopathy, no meningismus   RESP:  no increased work of breathing, clear to auscultation bilaterally CVS:   regular rate and rhythm Skin:   warm and dry, no rash on visible skin    UC Treatments / Results  Labs (all labs ordered are listed, but only abnormal results are displayed) Labs Reviewed  SARS CORONAVIRUS 2 BY RT PCR - Abnormal; Notable for the following components:      Result Value   SARS Coronavirus 2 by RT PCR POSITIVE (*)    All other components within normal limits  GROUP A STREP BY PCR    EKG   Radiology No results found.  Procedures Procedures (including critical care time)  Medications Ordered in UC Medications - No data to display  Initial Impression / Assessment and Plan / UC Course  I have reviewed the triage vital signs and the nursing notes.  Pertinent labs & imaging results that were available during my care of the patient were reviewed by me and considered in my medical decision making (see chart for details).       Pt is a 42 y.o. female who presents for 2-3 days of respiratory symptoms. Veronica Jenkins is afebrile here without recent antipyretics. Satting well on room air. Overall pt is ill but non-toxic appearing, well hydrated, without respiratory distress. Pulmonary exam is unremarkable.  Strep PCR is negative.  COVID testing obtained and is positive.  Discussed antivirals for COVID and they were declined.   Tessalon Perles and Promethazine DM as needed for cough.  History consistent with viral respiratory illness. Discussed symptomatic treatment.  Explained lack of efficacy of antibiotics in viral disease.  Typical duration of symptoms discussed.   Veronica Jenkins is hypertensive here.  BP 164/92.  Cardiopulmonary exam was unremarkable.  I suspect high blood pressure issue is secondary to acute illness.  She has not prescribed any antihypertensive medications.  On chart review, her most recent blood pressures have been normal.  Recommended she check herblood pressure and follow up with her primary care provider in the next 2 weeks.    Return and ED precautions given and voiced understanding. Discussed MDM, treatment plan and plan for follow-up with patient who agrees with plan.     Final Clinical Impressions(s) / UC Diagnoses   Final diagnoses:  COVID-19     Discharge Instructions      Your strep test is negative however your test for COVID-19 was positive, meaning that you were infected with the novel coronavirus and could give the germ to others.  The recommendations suggest returning to normal activities when, for at least 24 hours, symptoms are improving overall, and if a fever was present, it has been gone without use of a fever-reducing medication.  You should wear a mask for the next 5 days to prevent the spread of disease. Please continue good preventive care measures, including:  frequent hand-washing, avoid touching your face, cover coughs/sneezes, stay out of crowds and keep a 6 foot distance from others.  Go to the nearest hospital emergency room if fever/cough/breathlessness are severe or illness seems like a threat to life.  If your were prescribed medication. Stop by the pharmacy to pick it up. You can take Tylenol and/or Ibuprofen as needed for fever reduction and pain relief.    For cough: honey 1/2 to 1 teaspoon (you can dilute the honey in water or another fluid).  You can also use  guaifenesin and dextromethorphan for cough. You can use a humidifier for chest congestion and cough.  If you don't have a humidifier, you can sit in the bathroom with the hot shower running.      For sore throat: try warm salt water gargles, Mucinex sore throat cough drops or cepacol lozenges, throat spray, warm tea or water with lemon/honey, popsicles or ice, or OTC cold relief medicine for throat discomfort. You can also purchase chloraseptic spray at the pharmacy or dollar store.   For congestion: take a daily anti-histamine like Zyrtec, Claritin, and a oral decongestant, such as pseudoephedrine.  You can also use Flonase 1-2 sprays in each nostril daily. Afrin is also a good option, if you do not have high blood pressure.    It is important to stay hydrated: drink plenty of fluids (water, gatorade/powerade/pedialyte, juices, or teas) to keep your throat moisturized and help further relieve irritation/discomfort.    Return or go to the Emergency Department if symptoms worsen or do not improve in the next few days     ED Prescriptions     Medication Sig Dispense Auth. Provider   benzonatate (TESSALON) 100 MG capsule Take 1 capsule (100 mg total) by mouth every 8 (eight) hours. 21 capsule Alfie Rideaux, DO   promethazine-dextromethorphan (PROMETHAZINE-DM) 6.25-15 MG/5ML syrup Take 5 mLs by mouth 4 (four) times daily as needed. 118 mL Katha Cabal, DO      PDMP not reviewed this  encounter.   Katha Cabal, DO 05/07/23 1006

## 2023-05-23 ENCOUNTER — Telehealth: Payer: No Typology Code available for payment source | Admitting: Physician Assistant

## 2023-05-23 DIAGNOSIS — J028 Acute pharyngitis due to other specified organisms: Secondary | ICD-10-CM | POA: Diagnosis not present

## 2023-05-23 DIAGNOSIS — B9689 Other specified bacterial agents as the cause of diseases classified elsewhere: Secondary | ICD-10-CM | POA: Diagnosis not present

## 2023-05-23 MED ORDER — AZITHROMYCIN 250 MG PO TABS: ORAL_TABLET | ORAL | 0 refills | Status: AC

## 2023-05-23 NOTE — Progress Notes (Signed)
E-Visit for Sore Throat - Strep Symptoms  We are sorry that you are not feeling well.  Here is how we plan to help!  Based on what you have shared with me it is likely that you have strep pharyngitis.  Strep pharyngitis is inflammation and infection in the back of the throat.  This is an infection cause by bacteria and is treated with antibiotics.  I have prescribed Azithromycin 250 mg two tablets today and then one daily for 4 additional days. For throat pain, we recommend over the counter oral pain relief medications such as acetaminophen or aspirin, or anti-inflammatory medications such as ibuprofen or naproxen sodium. Topical treatments such as oral throat lozenges or sprays may be used as needed. Strep infections are not as easily transmitted as other respiratory infections, however we still recommend that you avoid close contact with loved ones, especially the very young and elderly.  Remember to wash your hands thoroughly throughout the day as this is the number one way to prevent the spread of infection and wipe down door knobs and counters with disinfectant.   Home Care: Only take medications as instructed by your medical team. Complete the entire course of an antibiotic. Do not take these medications with alcohol. A steam or ultrasonic humidifier can help congestion.  You can place a towel over your head and breathe in the steam from hot water coming from a faucet. Avoid close contacts especially the very young and the elderly. Cover your mouth when you cough or sneeze. Always remember to wash your hands.  Get Help Right Away If: You develop worsening fever or sinus pain. You develop a severe head ache or visual changes. Your symptoms persist after you have completed your treatment plan.  Make sure you Understand these instructions. Will watch your condition. Will get help right away if you are not doing well or get worse.   Thank you for choosing an e-visit.  Your e-visit  answers were reviewed by a board certified advanced clinical practitioner to complete your personal care plan. Depending upon the condition, your plan could have included both over the counter or prescription medications.  Please review your pharmacy choice. Make sure the pharmacy is open so you can pick up prescription now. If there is a problem, you may contact your provider through [employer] and have the prescription routed to another pharmacy.  Your safety is important to [place]. If you have drug allergies check your prescription carefully.  excuse. You will get an email in the next two days asking about your experience. I hope that your e-visit has been valuable and will speed your recovery.   I have spent 5 minutes in review of e-visit questionnaire, review and updating patient chart, medical decision making and response to patient.   Margaretann Loveless, PA-C

## 2023-06-17 ENCOUNTER — Ambulatory Visit
Admission: RE | Admit: 2023-06-17 | Discharge: 2023-06-17 | Disposition: A | Discharge status: Home / Self Care | Payer: No Typology Code available for payment source | Source: Ambulatory Visit | Attending: Nurse Practitioner | Admitting: Nurse Practitioner

## 2023-06-17 DIAGNOSIS — Z1231 Encounter for screening mammogram for malignant neoplasm of breast: Secondary | ICD-10-CM | POA: Diagnosis present

## 2024-05-06 ENCOUNTER — Other Ambulatory Visit: Payer: Self-pay | Admitting: Nurse Practitioner

## 2024-05-06 DIAGNOSIS — Z1231 Encounter for screening mammogram for malignant neoplasm of breast: Secondary | ICD-10-CM

## 2024-07-21 ENCOUNTER — Ambulatory Visit
Admission: RE | Admit: 2024-07-21 | Discharge: 2024-07-21 | Disposition: A | Discharge status: Home / Self Care | Source: Ambulatory Visit | Attending: Nurse Practitioner | Admitting: Nurse Practitioner

## 2024-07-21 DIAGNOSIS — Z1231 Encounter for screening mammogram for malignant neoplasm of breast: Secondary | ICD-10-CM | POA: Insufficient documentation
